# Patient Record
Sex: Female | Born: 1950 | Race: White | Hispanic: No | Marital: Married | Smoking: Never smoker
Health system: Southern US, Community
[De-identification: ages and names within clinical notes are randomized; demographics above are authoritative.]

## PROBLEM LIST (undated history)

## (undated) DIAGNOSIS — K219 Gastro-esophageal reflux disease without esophagitis: Secondary | ICD-10-CM

## (undated) DIAGNOSIS — E559 Vitamin D deficiency, unspecified: Secondary | ICD-10-CM

## (undated) DIAGNOSIS — E785 Hyperlipidemia, unspecified: Secondary | ICD-10-CM

## (undated) DIAGNOSIS — T7840XA Allergy, unspecified, initial encounter: Secondary | ICD-10-CM

## (undated) DIAGNOSIS — M810 Age-related osteoporosis without current pathological fracture: Secondary | ICD-10-CM

## (undated) HISTORY — DX: Age-related osteoporosis without current pathological fracture: M81.0

## (undated) HISTORY — DX: Allergy, unspecified, initial encounter: T78.40XA

## (undated) HISTORY — DX: Vitamin D deficiency, unspecified: E55.9

## (undated) HISTORY — DX: Hyperlipidemia, unspecified: E78.5

## (undated) HISTORY — DX: Gastro-esophageal reflux disease without esophagitis: K21.9

---

## 1998-09-04 ENCOUNTER — Other Ambulatory Visit: Admission: RE | Admit: 1998-09-04 | Discharge: 1998-09-04 | Payer: Self-pay | Admitting: Obstetrics and Gynecology

## 1998-12-11 ENCOUNTER — Ambulatory Visit: Admission: RE | Admit: 1998-12-11 | Discharge: 1998-12-11 | Payer: Self-pay | Admitting: Gynecologic Oncology

## 1999-08-13 ENCOUNTER — Other Ambulatory Visit: Admission: RE | Admit: 1999-08-13 | Discharge: 1999-08-13 | Payer: Self-pay | Admitting: Radiology

## 1999-09-11 ENCOUNTER — Other Ambulatory Visit: Admission: RE | Admit: 1999-09-11 | Discharge: 1999-09-11 | Payer: Self-pay | Admitting: Obstetrics and Gynecology

## 1999-10-01 ENCOUNTER — Ambulatory Visit (HOSPITAL_COMMUNITY): Admission: RE | Admit: 1999-10-01 | Discharge: 1999-10-01 | Payer: Self-pay | Admitting: Gastroenterology

## 2000-07-01 ENCOUNTER — Encounter: Payer: Self-pay | Admitting: Obstetrics and Gynecology

## 2000-07-01 ENCOUNTER — Encounter: Admission: RE | Admit: 2000-07-01 | Discharge: 2000-07-01 | Payer: Self-pay | Admitting: Obstetrics and Gynecology

## 2001-09-07 ENCOUNTER — Other Ambulatory Visit: Admission: RE | Admit: 2001-09-07 | Discharge: 2001-09-07 | Payer: Self-pay | Admitting: Obstetrics and Gynecology

## 2002-11-29 ENCOUNTER — Other Ambulatory Visit: Admission: RE | Admit: 2002-11-29 | Discharge: 2002-11-29 | Payer: Self-pay | Admitting: Obstetrics and Gynecology

## 2003-12-05 ENCOUNTER — Other Ambulatory Visit: Admission: RE | Admit: 2003-12-05 | Discharge: 2003-12-05 | Payer: Self-pay | Admitting: Obstetrics and Gynecology

## 2004-12-18 ENCOUNTER — Other Ambulatory Visit: Admission: RE | Admit: 2004-12-18 | Discharge: 2004-12-18 | Payer: Self-pay | Admitting: Obstetrics and Gynecology

## 2009-03-20 ENCOUNTER — Encounter (INDEPENDENT_AMBULATORY_CARE_PROVIDER_SITE_OTHER): Payer: Self-pay | Admitting: *Deleted

## 2009-10-07 ENCOUNTER — Encounter (INDEPENDENT_AMBULATORY_CARE_PROVIDER_SITE_OTHER): Payer: Self-pay | Admitting: *Deleted

## 2009-11-06 ENCOUNTER — Encounter (INDEPENDENT_AMBULATORY_CARE_PROVIDER_SITE_OTHER): Payer: Self-pay | Admitting: *Deleted

## 2009-11-11 ENCOUNTER — Ambulatory Visit: Payer: Self-pay | Admitting: Gastroenterology

## 2009-11-26 ENCOUNTER — Ambulatory Visit: Payer: Self-pay | Admitting: Gastroenterology

## 2010-04-15 NOTE — Miscellaneous (Signed)
Summary: [facility]  Clinical Lists Changes  Medications: Added new medication of MOVIPREP 100 GM  SOLR (PEG-KCL-NACL-NASULF-NA ASC-C) As per prep instructions. - Signed Rx of MOVIPREP 100 GM  SOLR (PEG-KCL-NACL-NASULF-NA ASC-C) As per prep instructions.;  #1 x 0;  Signed;  Entered by: Durwin Glaze RN;  Authorized by: Louis Meckel MD;  Method used: Electronically to [facility]  [phone]*, [address] [place], [place], [place]  [address], Ph: [phone] or [phone], Fax: [phone] Observations: Added new observation of NKA: T (11/11/2009 12:38)    Prescriptions: MOVIPREP 100 GM  SOLR (PEG-KCL-NACL-NASULF-NA ASC-C) As per prep instructions.  #1 x 0   Entered by:   Durwin Glaze RN   Authorized by:   Louis Meckel MD   Signed by:   Durwin Glaze RN on 11/11/2009   Method used:   Electronically to        [facility]  [phone]* (retail)       [address]       [place], [place]  [address]       Ph: [phone] or [phone]       Fax: [phone]   RxID:   [id number]

## 2010-04-15 NOTE — Letter (Signed)
Summary: Previsit letter  [facility]  [address] [place], [place] [address]   Phone: [phone]  Fax: [phone]       10/07/2009 MRN: [id number]  Sheila Gray [address] [place], [place]  [address]  [place]  Dear Ms. Szeto,  Welcome to the Gastroenterology Division at [facility].    You are scheduled to see a nurse for your pre-procedure visit on November 11, 2009 at 1:00pm on the 3rd floor at [facility], [address]  We ask that you try to arrive at our office 15 minutes prior to your appointment time to allow for check-in.  Your nurse visit will consist of discussing your medical and surgical history, your immediate family medical history, and your medications.    Please bring a complete list of all your medications or, if you prefer, bring the medication bottles and we will list them.  We will need to be aware of both prescribed and over the counter drugs.  We will need to know exact dosage information as well.  If you are on blood thinners (Coumadin, Plavix, Aggrenox, Ticlid, etc.) please call our office today/prior to your appointment, as we need to consult with your physician about holding your medication.   Please be prepared to read and sign documents such as consent forms, a financial agreement, and acknowledgement forms.  If necessary, and with your consent, a friend or relative is welcome to sit-in on the nurse visit with you.  Please bring your insurance card so that we may make a copy of it.  If your insurance requires a referral to see a specialist, please bring your referral form from your primary care physician.  No co-pay is required for this nurse visit.     If you cannot keep your appointment, please call [phone] to cancel or reschedule prior to your appointment date.  This allows [place] the opportunity to schedule an appointment for another patient in need of care.    Thank you for choosing [facility] for your  medical needs.  We appreciate the opportunity to care for you.  Please visit [place] at our website  to learn more about our practice.                     Sincerely.                                                                                                                   The Gastroenterology Division

## 2010-04-15 NOTE — Letter (Signed)
Summary: Moviprep Instructions  [facility]  [address]   [place], [place] [address]   Phone: [phone]  Fax: [phone]       Sheila Gray    [birth year]    MRN: [id number]        Procedure Day Dorna Bloom: Tuesday, 11-26-09     Arrival Time: 8:30 a.m.     Procedure Time: 9:30 a.m.     Location of Procedure:                     x  [facility] (4th Floor)   PREPARATION FOR COLONOSCOPY WITH MOVIPREP   Starting 5 days prior to your procedure 11-21-09  do not eat nuts, seeds, popcorn, corn, beans, peas,  salads, or any raw vegetables.  Do not take any fiber supplements (e.g. Metamucil, Citrucel, and Benefiber).  THE DAY BEFORE YOUR PROCEDURE         DATE:  11-25-09   DAY: Monday  1.  Drink clear liquids the entire day-NO SOLID FOOD  2.  Do not drink anything colored red or purple.  Avoid juices with pulp.  No orange juice.  3.  Drink at least 64 oz. (8 glasses) of fluid/clear liquids during the day to prevent dehydration and help the prep work efficiently.  CLEAR LIQUIDS INCLUDE: Water Jello Ice Popsicles Tea (sugar ok, no milk/cream) Powdered fruit flavored drinks Coffee (sugar ok, no milk/cream) Gatorade Juice: apple, white grape, white cranberry  Lemonade Clear bullion, consomm, broth Carbonated beverages (any kind) Strained chicken noodle soup Hard Candy                             4.  In the morning, mix first dose of MoviPrep solution:    Empty 1 Pouch A and 1 Pouch B into the disposable container    Add lukewarm drinking water to the top line of the container. Mix to dissolve    Refrigerate (mixed solution should be used within 24 hrs)  5.  Begin drinking the prep at 5:00 p.m. The MoviPrep container is divided by 4 marks.   Every 15 minutes drink the solution down to the next mark (approximately 8 oz) until the full liter is complete.   6.  Follow completed prep with 16 oz of clear liquid of your choice (Nothing red or purple).   Continue to drink clear liquids until bedtime.  7.  Before going to bed, mix second dose of MoviPrep solution:    Empty 1 Pouch A and 1 Pouch B into the disposable container    Add lukewarm drinking water to the top line of the container. Mix to dissolve    Refrigerate  THE DAY OF YOUR PROCEDURE      DATE: 11-26-09   DAY: Tuesday  Beginning at  4:30 a.m. (5 hours before procedure)         1. Every 15 minutes, drink the solution down to the next mark (approx 8 oz) until the full liter is complete.  2. Follow completed prep with 16 oz. of clear liquid of your choice.    3. You may drink clear liquids until 7:30 a.m. (2 Hours before procedure).   MEDICATION INSTRUCTIONS  Unless otherwise instructed, you should take regular prescription medications with a small sip of water   as early as possible the morning of your procedure.          OTHER INSTRUCTIONS  You  will need a responsible adult at least 61 years of age to accompany you and drive you home.   This person must remain in the waiting room during your procedure.  Wear loose fitting clothing that is easily removed.  Leave jewelry and other valuables at home.  However, you may wish to bring a book to read or  an iPod/MP3 player to listen to music as you wait for your procedure to start.  Remove all body piercing jewelry and leave at home.  Total time from sign-in until discharge is approximately 2-3 hours.  You should go home directly after your procedure and rest.  You can resume normal activities the  day after your procedure.  The day of your procedure you should not:   Drive   Make legal decisions   Operate machinery   Drink alcohol   Return to work  You will receive specific instructions about eating, activities and medications before you leave.    The above instructions have been reviewed and explained to me by   Durwin Glaze RN  November 11, 2009 12:58 PM    I fully understand and can verbalize  these instructions _____________________________ Date _________

## 2010-04-15 NOTE — Procedures (Signed)
Summary: Colonoscopy  Patient: Sheila Gray Note: All result statuses are Final unless otherwise noted.  Tests: (1) Colonoscopy (COL)   COL Colonoscopy           DONE     [facility]     [address]     [place], [place]  [address]           COLONOSCOPY PROCEDURE REPORT           PATIENT:  Sheila Gray, Sheila Gray  MR#:  [id number]     BIRTHDATE:  May 11, 1950, 59 yrs. old  GENDER:  female           ENDOSCOPIST:  Barbette Hair. Arlyce Dice, MD     Referred by:           PROCEDURE DATE:  11/26/2009     PROCEDURE:  Diagnostic Colonoscopy     ASA CLASS:  Class I     INDICATIONS:  1) Routine Risk Screening           MEDICATIONS:   Fentanyl 75 mcg IV, Versed 9 mg IV           DESCRIPTION OF PROCEDURE:   After the risks benefits and     alternatives of the procedure were thoroughly explained, informed     consent was obtained.  Digital rectal exam was performed and     revealed no abnormalities.   The LB CF-[id number] endoscope     was introduced through the anus and advanced to the cecum, which     was identified by both the appendix and ileocecal valve, without     limitations.  The quality of the prep was excellent, using     MoviPrep.  The instrument was then slowly withdrawn as the colon     was fully examined.     <<PROCEDUREIMAGES>>           FINDINGS:  A normal appearing cecum, ileocecal valve, and     appendiceal orifice were identified. The ascending, hepatic     flexure, transverse, splenic flexure, descending, sigmoid colon,     and rectum appeared unremarkable (see image1, image4, image5,     image6, image7, image8, image9, image10, image13, image14,     image16, and image18).   Retroflexed views in the rectum revealed     no abnormalities.    The time to cecum =  9.25  minutes. The scope     was then withdrawn (time =  6.75  min) from the patient and the     procedure completed.           COMPLICATIONS:  None           ENDOSCOPIC IMPRESSION:     1) Normal colon  RECOMMENDATIONS:     1) Continue current colorectal screening recommendations for     "routine risk" patients with a repeat colonoscopy in 10 years.           REPEAT EXAM:  In 10 year(s) for Colonoscopy.           ______________________________     Barbette Hair. Arlyce Dice, MD           CC: [name], MD           n.     Rosalie Doctor:   Barbette Hair. Kaplan at 11/26/2009 10:38 AM           Edwina Barth, [phone]  Note: An exclamation mark (!) indicates a result that was not  dispersed into the flowsheet. Document Creation Date: 11/26/2009 10:39 AM _______________________________________________________________________  (1) Order result status: Final Collection or observation date-time: 11/26/2009 10:32 Requested date-time:  Receipt date-time:  Reported date-time:  Referring Physician:   Ordering Physician: Melvia Heaps [phone]) Specimen Source:  Source: Launa Grill Order Number: [id number] Lab site:   Appended Document: Colonoscopy    Clinical Lists Changes  Observations: Added new observation of COLONNXTDUE: 11/2019 (11/26/2009 14:39)

## 2010-04-15 NOTE — Letter (Signed)
Summary: Colonoscopy Letter  [facility]  [address] [place], [place] [address]   Phone: [phone]  Fax: [phone]      March 20, 2009 MRN: [id number]   Sheila Gray [address] [place], [place]  [address]   Dear Ms. Berrocal,   According to your medical record, it is time for you to schedule a Colonoscopy. The American Cancer Society recommends this procedure as a method to detect early colon cancer. Patients with a family history of colon cancer, or a personal history of colon polyps or inflammatory bowel disease are at increased risk.  This letter has beeen generated based on the recommendations made at the time of your procedure. If you feel that in your particular situation this may no longer apply, please contact our office.  Please call our office at [phone] to schedule this appointment or to update your records at your earliest convenience.  Thank you for cooperating with [place] to provide you with the very best care possible.   Sincerely,  Barbette Hair. Arlyce Dice, M.D.  [facility] [phone]

## 2010-08-01 NOTE — Procedures (Signed)
[facility]  Patient:    Sheila Gray                            MRN: [id number] Proc. Date: 10/01/99 Attending:  Barbette Hair. Arlyce Dice, M.D. [facility] CC:         Miguel Aschoff, M.D.                           Procedure Report  PROCEDURE:  Colonoscopy.  HISTORY OF PRESENT ILLNESS:  Ms. Wander has heme positive stools. She has recently has symptomatic hemorrhoids. She has a family history of colon polyps/colonoscopy 5 years ago was negative.  INFORMED CONSENT:  The patient provided consent after risks, benefits, and alternatives were explained.  MEDICATIONS:  Versed 8, fentanyl 100 mcg IV.  DESCRIPTION OF PROCEDURE:  The patient was placed in the left lateral decubitus position and administered continuous low flow oxygen and was placed on pulse oximetry. The Olympus video colonoscope was inserted into the cecum which was identified by the ileocecal valve. The prep was good. The scope was then withdrawn. All areas of the colon were examined.  FINDINGS: 1. Internal hemorrhoids were seen on retroflexed view of the rectal vault. 2. Normal sigmoid, descending, transverse, ascending colon and cecum.  IMPRESSION: 1. Internal hemorrhoids. 2. Heme positive stools--probably secondary to #1.  RECOMMENDATIONS: Follow-up hemoccults in 2 weeks; If still positive would consider upper endoscopy. DD:  10/01/99 TD:  10/03/99 Job: [id number]

## 2011-04-02 ENCOUNTER — Ambulatory Visit
Admission: RE | Admit: 2011-04-02 | Discharge: 2011-04-02 | Disposition: A | Discharge status: Home / Self Care | Payer: BC Managed Care – PPO | Source: Ambulatory Visit | Attending: Neurology | Admitting: Neurology

## 2011-04-02 ENCOUNTER — Other Ambulatory Visit: Payer: Self-pay | Admitting: Neurology

## 2011-04-02 DIAGNOSIS — IMO0001 Reserved for inherently not codable concepts without codable children: Secondary | ICD-10-CM

## 2012-11-02 ENCOUNTER — Other Ambulatory Visit: Payer: Self-pay | Admitting: Internal Medicine

## 2012-11-02 DIAGNOSIS — M542 Cervicalgia: Secondary | ICD-10-CM

## 2012-11-02 DIAGNOSIS — R202 Paresthesia of skin: Secondary | ICD-10-CM

## 2012-11-04 ENCOUNTER — Ambulatory Visit
Admission: RE | Admit: 2012-11-04 | Discharge: 2012-11-04 | Disposition: A | Discharge status: Home / Self Care | Payer: BC Managed Care – PPO | Source: Ambulatory Visit | Attending: Internal Medicine | Admitting: Internal Medicine

## 2012-11-04 DIAGNOSIS — R202 Paresthesia of skin: Secondary | ICD-10-CM

## 2012-11-04 DIAGNOSIS — M542 Cervicalgia: Secondary | ICD-10-CM

## 2013-04-10 ENCOUNTER — Ambulatory Visit (INDEPENDENT_AMBULATORY_CARE_PROVIDER_SITE_OTHER): Payer: BC Managed Care – PPO | Admitting: Internal Medicine

## 2013-04-10 ENCOUNTER — Encounter: Payer: Self-pay | Admitting: Internal Medicine

## 2013-04-10 VITALS — BP 122/76 | HR 68 | Temp 97.9°F | Resp 18 | Wt 113.6 lb

## 2013-04-10 DIAGNOSIS — J301 Allergic rhinitis due to pollen: Secondary | ICD-10-CM | POA: Insufficient documentation

## 2013-04-10 DIAGNOSIS — N3000 Acute cystitis without hematuria: Secondary | ICD-10-CM

## 2013-04-10 DIAGNOSIS — I10 Essential (primary) hypertension: Secondary | ICD-10-CM

## 2013-04-10 DIAGNOSIS — M810 Age-related osteoporosis without current pathological fracture: Secondary | ICD-10-CM | POA: Insufficient documentation

## 2013-04-10 DIAGNOSIS — E559 Vitamin D deficiency, unspecified: Secondary | ICD-10-CM | POA: Insufficient documentation

## 2013-04-10 DIAGNOSIS — R109 Unspecified abdominal pain: Secondary | ICD-10-CM

## 2013-04-10 LAB — CBC WITH DIFFERENTIAL/PLATELET
BASOS PCT: 0 % (ref 0–1)
Basophils Absolute: 0 10*3/uL (ref 0.0–0.1)
Eosinophils Absolute: 0.1 10*3/uL (ref 0.0–0.7)
Eosinophils Relative: 2 % (ref 0–5)
HCT: 41.9 % (ref 36.0–46.0)
HEMOGLOBIN: 14.2 g/dL (ref 12.0–15.0)
LYMPHS ABS: 2.3 10*3/uL (ref 0.7–4.0)
LYMPHS PCT: 37 % (ref 12–46)
MCH: 30.1 pg (ref 26.0–34.0)
MCHC: 33.9 g/dL (ref 30.0–36.0)
MCV: 89 fL (ref 78.0–100.0)
MONOS PCT: 7 % (ref 3–12)
Monocytes Absolute: 0.4 10*3/uL (ref 0.1–1.0)
NEUTROS ABS: 3.3 10*3/uL (ref 1.7–7.7)
NEUTROS PCT: 54 % (ref 43–77)
Platelets: 249 10*3/uL (ref 150–400)
RBC: 4.71 MIL/uL (ref 3.87–5.11)
RDW: 13.8 % (ref 11.5–15.5)
WBC: 6.1 10*3/uL (ref 4.0–10.5)

## 2013-04-10 MED ORDER — METRONIDAZOLE 250 MG PO TABS: 250.0000 mg | ORAL_TABLET | Freq: Three times a day (TID) | ORAL | Status: AC

## 2013-04-10 MED ORDER — HYOSCYAMINE SULFATE 0.125 MG SL SUBL: SUBLINGUAL_TABLET | SUBLINGUAL | Status: DC

## 2013-04-10 NOTE — Patient Instructions (Signed)
Pending your lab test results,  Take your Cipro 500 mg tabs twice daily with food And  New Rx Flagyl 250 mg 3 x daily with food   If not significantly better by Luanna Salk or Thurs Morning -  Call for recheck or to schedule Abdominal Ultrasound   Call if symptoms change         Abdominal Pain, Adult Many things can cause abdominal pain. Usually, abdominal pain is not caused by a disease and will improve without treatment. It can often be observed and treated at home. Your health care provider will do a physical exam and possibly order blood tests and X-rays to help determine the seriousness of your pain. However, in many cases, more time must pass before a clear cause of the pain can be found. Before that point, your health care provider may not know if you need more testing or further treatment. HOME CARE INSTRUCTIONS  Monitor your abdominal pain for any changes. The following actions may help to alleviate any discomfort you are experiencing:  Only take over-the-counter or prescription medicines as directed by your health care provider.  Do not take laxatives unless directed to do so by your health care provider.  Try a clear liquid diet (broth, tea, or water) as directed by your health care provider. Slowly move to a bland diet as tolerated. SEEK MEDICAL CARE IF:  You have unexplained abdominal pain.  You have abdominal pain associated with nausea or diarrhea.  You have pain when you urinate or have a bowel movement.  You experience abdominal pain that wakes you in the night.  You have abdominal pain that is worsened or improved by eating food.  You have abdominal pain that is worsened with eating fatty foods. SEEK IMMEDIATE MEDICAL CARE IF:   Your pain does not go away within 2 hours.  You have a fever.  You keep throwing up (vomiting).  Your pain is felt only in portions of the abdomen, such as the right side or the left lower portion of the abdomen.  You pass  bloody or black tarry stools. MAKE SURE YOU:  Understand these instructions.   Will watch your condition.   Will get help right away if you are not doing well or get worse.  Document Released: 12/10/2004 Document Revised: 12/21/2012 Document Reviewed: 11/09/2012 [facility] Patient Information 2014 [place].   Abdominal Pain, Adult Many things can cause belly (abdominal) pain. Most times, the belly pain is not dangerous. Many cases of belly pain can be watched and treated at home. HOME CARE   Do not take medicines that help you go poop (laxatives) unless told to by your doctor.  Only take medicine as told by your doctor.  Eat or drink as told by your doctor. Your doctor will tell you if you should be on a special diet. GET HELP IF:  You do not know what is causing your belly pain.  You have belly pain while you are sick to your stomach (nauseous) or have runny poop (diarrhea).  You have pain while you pee or poop.  Your belly pain wakes you up at night.  You have belly pain that gets worse or better when you eat.  You have belly pain that gets worse when you eat fatty foods. GET HELP RIGHT AWAY IF:   The pain does not go away within 2 hours.  You have a fever.  You keep throwing up (vomiting).  The pain changes and is only in  the right or left part of the belly.  You have bloody or tarry looking poop. MAKE SURE YOU:   Understand these instructions.  Will watch your condition.  Will get help right away if you are not doing well or get worse. Document Released: 08/19/2007 Document Revised: 12/21/2012 Document Reviewed: 11/09/2012 [facility] Patient Information 2014 [place].

## 2013-04-10 NOTE — Progress Notes (Signed)
   Subjective:    Patient ID: Sheila Gray, female    DOB: 07/14/1950, 63 y.o.   MRN: [id number]  Urinary Tract Infection  Associated symptoms include chills and nausea. Pertinent negatives include no frequency, hematuria or vomiting.  Abdominal Pain This is a new problem. The current episode started in the past 7 days. The onset quality is sudden. The problem occurs constantly. The problem has been waxing and waning. The pain is located in the periumbilical region. The pain is mild. The quality of the pain is aching, colicky and cramping. The abdominal pain radiates to the RUQ. Associated symptoms include belching, a fever, flatus and nausea. Pertinent negatives include no anorexia, constipation, diarrhea, dysuria, frequency, headaches, hematochezia, hematuria, melena, myalgias, vomiting or weight loss.      Review of Systems  Constitutional: Positive for fever, chills and diaphoresis. Negative for weight loss.  HENT: Negative.   Eyes: Negative.   Respiratory: Negative.   Cardiovascular: Negative.   Gastrointestinal: Positive for nausea, abdominal pain and flatus. Negative for vomiting, diarrhea, constipation, melena, hematochezia and anorexia.  Endocrine: Negative.   Genitourinary: Negative.  Negative for dysuria, frequency and hematuria.  Musculoskeletal: Negative for myalgias.  Allergic/Immunologic: Negative.   Neurological: Negative.  Negative for headaches.  Hematological: Positive for adenopathy.  Psychiatric/Behavioral: Negative.        Objective:   Physical Exam  Nursing note and vitals reviewed. Constitutional: She is oriented to person, place, and time. She appears well-developed and well-nourished.  HENT:  Head: Normocephalic and atraumatic.  Mouth/Throat: No oropharyngeal exudate.  Eyes: EOM are normal. Pupils are equal, round, and reactive to light.  Neck: Normal range of motion. Neck supple. No JVD present. No thyromegaly present.  Cardiovascular: Normal rate,  regular rhythm and normal heart sounds.   No murmur heard. Pulmonary/Chest: Effort normal and breath sounds normal. No respiratory distress. She has no wheezes. She has no rales.  Abdominal: She exhibits distension. There is no tenderness. There is no rebound.  Slight distension with RUQ tenderness w/o guarding or rebound. BS hyperactive  Musculoskeletal: Normal range of motion. She exhibits no edema and no tenderness.  Lymphadenopathy:    She has no cervical adenopathy.  Neurological: She is alert and oriented to person, place, and time. No cranial nerve deficit.  Skin: Skin is warm and dry. No rash noted. No erythema.          Assessment & Plan:  1. Abdominal pain, Obscure etiology - DDx includes GB, Gastroduodenal, SB or Colon or UTI.  - Urine Microscopic - Urine culture - CBC with Differential - Hepatic function panel - BASIC METABOLIC PANEL WITH GFR - Amylase  Has Rx Cipro 500 mg - advised to take bid pending labs and clinical response MetroNIDAZOLE (FLAGYL) 250 MG tablet; Take 1 tablet (250 mg total) by mouth 3 (three) times daily.  Dispense: 30 tablet; Refill: 0 Hyoscyamine (LEVSIN SL) 0.125 MG SL tablet; Take 1 or 2 tablets under tongue 4 x day or every 4 hours as needed for nausea, bloating, cramping or diarrhea ( Max 8 tab/24 Hr)  Dispense: 30 tablet; Refill: 3 Diet discussed.

## 2013-04-11 LAB — HEPATIC FUNCTION PANEL
ALT: 12 U/L (ref 0–35)
AST: 16 U/L (ref 0–37)
Albumin: 4.7 g/dL (ref 3.5–5.2)
Alkaline Phosphatase: 44 U/L (ref 39–117)
BILIRUBIN DIRECT: 0.1 mg/dL (ref 0.0–0.3)
BILIRUBIN INDIRECT: 0.3 mg/dL (ref 0.0–0.9)
BILIRUBIN TOTAL: 0.4 mg/dL (ref 0.3–1.2)
Total Protein: 7.3 g/dL (ref 6.0–8.3)

## 2013-04-11 LAB — BASIC METABOLIC PANEL WITH GFR
BUN: 15 mg/dL (ref 6–23)
CALCIUM: 10.2 mg/dL (ref 8.4–10.5)
CO2: 31 meq/L (ref 19–32)
Chloride: 100 mEq/L (ref 96–112)
Creat: 0.89 mg/dL (ref 0.50–1.10)
GFR, Est African American: 80 mL/min
GFR, Est Non African American: 69 mL/min
GLUCOSE: 86 mg/dL (ref 70–99)
Potassium: 4.7 mEq/L (ref 3.5–5.3)
SODIUM: 140 meq/L (ref 135–145)

## 2013-04-11 LAB — AMYLASE: Amylase: 76 U/L (ref 0–105)

## 2013-04-11 LAB — URINE CULTURE
COLONY COUNT: NO GROWTH
ORGANISM ID, BACTERIA: NO GROWTH

## 2013-04-11 LAB — URINALYSIS, MICROSCOPIC ONLY
Bacteria, UA: NONE SEEN
Casts: NONE SEEN
Crystals: NONE SEEN
SQUAMOUS EPITHELIAL / LPF: NONE SEEN

## 2013-11-09 ENCOUNTER — Encounter: Payer: Self-pay | Admitting: Internal Medicine

## 2013-11-09 ENCOUNTER — Ambulatory Visit (INDEPENDENT_AMBULATORY_CARE_PROVIDER_SITE_OTHER): Payer: BC Managed Care – PPO | Admitting: Internal Medicine

## 2013-11-09 VITALS — BP 116/64 | HR 78 | Temp 98.0°F | Resp 16 | Ht 62.0 in | Wt 113.0 lb

## 2013-11-09 DIAGNOSIS — E559 Vitamin D deficiency, unspecified: Secondary | ICD-10-CM

## 2013-11-09 DIAGNOSIS — Z111 Encounter for screening for respiratory tuberculosis: Secondary | ICD-10-CM

## 2013-11-09 DIAGNOSIS — R7402 Elevation of levels of lactic acid dehydrogenase (LDH): Secondary | ICD-10-CM

## 2013-11-09 DIAGNOSIS — Z1212 Encounter for screening for malignant neoplasm of rectum: Secondary | ICD-10-CM

## 2013-11-09 DIAGNOSIS — Z113 Encounter for screening for infections with a predominantly sexual mode of transmission: Secondary | ICD-10-CM

## 2013-11-09 DIAGNOSIS — Z8249 Family history of ischemic heart disease and other diseases of the circulatory system: Secondary | ICD-10-CM

## 2013-11-09 DIAGNOSIS — R7401 Elevation of levels of liver transaminase levels: Secondary | ICD-10-CM

## 2013-11-09 DIAGNOSIS — Z0000 Encounter for general adult medical examination without abnormal findings: Secondary | ICD-10-CM

## 2013-11-09 DIAGNOSIS — R740 Nonspecific elevation of levels of transaminase and lactic acid dehydrogenase [LDH]: Secondary | ICD-10-CM

## 2013-11-09 LAB — LIPID PANEL
Cholesterol: 236 mg/dL — ABNORMAL HIGH (ref 0–200)
HDL: 105 mg/dL (ref >39)
LDL CALC: 110 mg/dL — AB (ref 0–99)
TRIGLYCERIDES: 105 mg/dL (ref <150)
Total CHOL/HDL Ratio: 2.2 Ratio
VLDL: 21 mg/dL (ref 0–40)

## 2013-11-09 LAB — CBC WITH DIFFERENTIAL/PLATELET
BASOS PCT: 1 % (ref 0–1)
Basophils Absolute: 0.1 10*3/uL (ref 0.0–0.1)
EOS ABS: 0.1 10*3/uL (ref 0.0–0.7)
Eosinophils Relative: 2 % (ref 0–5)
HCT: 43 % (ref 36.0–46.0)
Hemoglobin: 14.9 g/dL (ref 12.0–15.0)
Lymphocytes Relative: 36 % (ref 12–46)
Lymphs Abs: 2.4 10*3/uL (ref 0.7–4.0)
MCH: 30.4 pg (ref 26.0–34.0)
MCHC: 34.7 g/dL (ref 30.0–36.0)
MCV: 87.8 fL (ref 78.0–100.0)
Monocytes Absolute: 0.3 10*3/uL (ref 0.1–1.0)
Monocytes Relative: 5 % (ref 3–12)
NEUTROS PCT: 56 % (ref 43–77)
Neutro Abs: 3.8 10*3/uL (ref 1.7–7.7)
PLATELETS: 244 10*3/uL (ref 150–400)
RBC: 4.9 MIL/uL (ref 3.87–5.11)
RDW: 14 % (ref 11.5–15.5)
WBC: 6.7 10*3/uL (ref 4.0–10.5)

## 2013-11-09 LAB — HEPATIC FUNCTION PANEL
ALK PHOS: 45 U/L (ref 39–117)
ALT: 13 U/L (ref 0–35)
AST: 15 U/L (ref 0–37)
Albumin: 4.7 g/dL (ref 3.5–5.2)
BILIRUBIN INDIRECT: 0.5 mg/dL (ref 0.2–1.2)
Bilirubin, Direct: 0.1 mg/dL (ref 0.0–0.3)
TOTAL PROTEIN: 7.4 g/dL (ref 6.0–8.3)
Total Bilirubin: 0.6 mg/dL (ref 0.2–1.2)

## 2013-11-09 LAB — VITAMIN B12: Vitamin B-12: 411 pg/mL (ref 211–911)

## 2013-11-09 LAB — BASIC METABOLIC PANEL WITH GFR
BUN: 16 mg/dL (ref 6–23)
CO2: 26 mEq/L (ref 19–32)
Calcium: 9.2 mg/dL (ref 8.4–10.5)
Chloride: 102 mEq/L (ref 96–112)
Creat: 0.8 mg/dL (ref 0.50–1.10)
GFR, EST NON AFRICAN AMERICAN: 78 mL/min
GFR, Est African American: >89 mL/min
GLUCOSE: 88 mg/dL (ref 70–99)
Potassium: 4.2 mEq/L (ref 3.5–5.3)
SODIUM: 138 meq/L (ref 135–145)

## 2013-11-09 LAB — TSH: TSH: 3.432 u[IU]/mL (ref 0.350–4.500)

## 2013-11-09 LAB — MAGNESIUM: Magnesium: 2.1 mg/dL (ref 1.5–2.5)

## 2013-11-09 LAB — HEMOGLOBIN A1C
Hgb A1c MFr Bld: 5.5 % (ref <5.7)
MEAN PLASMA GLUCOSE: 111 mg/dL (ref <117)

## 2013-11-09 NOTE — Progress Notes (Signed)
Patient ID: Sheila Gray, female   DOB: 10/17/1950, 62 y.o.   MRN: [id number]   Annual Screening Comprehensive Examination   This very nice 63 y.o.MWF presents for complete physical.  Patient has no major health issues.  Screening labs from 1 year ago were all WNL and patient has remained well since that time.    Medication Sig  . Calcium Carb w/D  Take by mouth daily.  Marland Kitchen VITAMIN D Take 4,000 Units by mouth daily.  . Ibuprofen 200 MG CAPS Take by mouth. 2 at bedtime prn  . MULTIVITAMIN Take 1 tablet by mouth daily.  . NON FORMULARY Allergy shot every 2 weeks  . PROBIOTIC DAILY Take by mouth.   No Known Allergies  Past Medical History  Diagnosis Date  . Allergy   . Osteoporosis   . Vitamin D deficiency    Hx/o (-) colonoscopies 1997, 2001, & 2010 per Dr Deatra Ina recc 10 year f/u in 2020.  Family History  Problem Relation Age of Onset  . Hypertension Mother   . Kidney disease Father   . Heart disease Father    History   Social History  . Marital Status: Married    Spouse Name: N/A    Number of Children: N/A  . Years of Education: N/A   Occupational History  . 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 Smoking status: Never Smoker   . Smokeless tobacco: Never Used  . Alcohol Use: 1.5 oz/week    3 drink(s) per week  . Drug Use: No  . Sexual Activity: Not on file    ROS Constitutional: Denies fever, chills, weight loss/gain, headaches, insomnia, fatigue, night sweats, and change in appetite. Eyes: Denies redness, blurred vision, diplopia, discharge, itchy, watery eyes.  ENT: Denies discharge, congestion, post nasal drip, epistaxis, sore throat, earache, hearing loss, dental pain, Tinnitus, Vertigo, Sinus pain, snoring.  Cardio: Denies chest pain, palpitations, irregular heartbeat, syncope, dyspnea, diaphoresis, orthopnea, PND, claudication, edema Respiratory: denies cough, dyspnea, DOE, pleurisy, hoarseness, laryngitis, wheezing.   Gastrointestinal: Denies dysphagia, heartburn, reflux, water brash, pain, cramps, nausea, vomiting, bloating, diarrhea, constipation, hematemesis, melena, hematochezia, jaundice, hemorrhoids Genitourinary: Denies dysuria, frequency, urgency, nocturia, hesitancy, discharge, hematuria, flank pain Breast: Breast lumps, nipple discharge, bleeding.  Musculoskeletal: Denies arthralgia, myalgia, stiffness, Jt. Swelling, pain, limp, and strain/sprain. Skin: Denies puritis, rash, hives, warts, acne, eczema, changing in skin lesion Neuro: Weakness, tremor, incoordination, spasms, paresthesia, pain Psychiatric: Denies confusion, memory loss, sensory loss. Endocrine: Denies change in weight, skin, hair change, nocturia, and paresthesia, diabetic polys, visual blurring, hyper /hypo glycemic episodes.  Heme/Lymph: No excessive bleeding, bruising, enlarged lymph nodes.  Physical Exam  BP 116/64  Pulse 78  Temp 98 F   Resp 16  Ht 5\' 2"    Wt 113 lb   BMI 20.66 kg/m2  General Appearance: Well nourished and in no apparent distress. Eyes: PERRLA, EOMs, conjunctiva no swelling or erythema, normal fundi and vessels. Sinuses: No frontal/maxillary tenderness ENT/Mouth: EACs patent / TMs  nl. Nares clear without erythema, swelling, mucoid exudates. Oral hygiene is good. No erythema, swelling, or exudate. Tongue normal, non-obstructing. Tonsils not swollen or erythematous. Hearing normal.  Neck: Supple, thyroid normal. No bruits, nodes or JVD. Respiratory: Respiratory effort normal.  BS equal and clear bilateral without rales, rhonci, wheezing or stridor. Cardio: Heart sounds are normal with regular rate and rhythm and no murmurs, rubs or gallops. Peripheral pulses are normal and equal bilaterally without edema.  No aortic or femoral bruits. Chest: symmetric with normal excursions and percussion. Breasts: Deferred to GYN.  Abdomen: Flat, soft, with bowl sounds. Nontender, no guarding, rebound, hernias, masses,  or organomegaly.  Lymphatics: Non tender without lymphadenopathy.  Genitourinary: Deferred to GYN. Musculoskeletal: Full ROM all peripheral extremities, joint stability, 5/5 strength, and normal gait. Skin: Warm and dry without rashes, lesions, cyanosis, clubbing or  ecchymosis.  Neuro: Cranial nerves intact, reflexes equal bilaterally. Normal muscle tone, no cerebellar symptoms. Sensation intact.  Pysch: Awake and oriented X 3, normal affect, Insight and Judgment appropriate.   Assessment and Plan  1. Annual Screening Examination  Continue prudent diet as discussed, weight control, regular exercise, and medications. Routine screening labs and tests as requested with regular follow-up as recommended.

## 2013-11-09 NOTE — Patient Instructions (Signed)

## 2013-11-10 LAB — HEPATITIS B SURFACE ANTIBODY,QUALITATIVE: Hep B S Ab: NEGATIVE

## 2013-11-10 LAB — URINALYSIS, MICROSCOPIC ONLY
Bacteria, UA: NONE SEEN
CRYSTALS: NONE SEEN
Casts: NONE SEEN
SQUAMOUS EPITHELIAL / LPF: NONE SEEN

## 2013-11-10 LAB — RPR

## 2013-11-10 LAB — MICROALBUMIN / CREATININE URINE RATIO
Creatinine, Urine: 52.9 mg/dL
Microalb Creat Ratio: 9.5 mg/g (ref 0.0–30.0)
Microalb, Ur: 0.5 mg/dL (ref 0.00–1.89)

## 2013-11-10 LAB — HEPATITIS B CORE ANTIBODY, TOTAL: HEP B C TOTAL AB: NONREACTIVE

## 2013-11-10 LAB — HEPATITIS C ANTIBODY: HCV AB: NEGATIVE

## 2013-11-10 LAB — HIV ANTIBODY (ROUTINE TESTING W REFLEX): HIV: NONREACTIVE

## 2013-11-10 LAB — HEPATITIS A ANTIBODY, TOTAL: Hep A Total Ab: NONREACTIVE

## 2013-11-10 LAB — INSULIN, FASTING: Insulin fasting, serum: 7.1 u[IU]/mL (ref 2.0–19.6)

## 2013-11-10 LAB — VITAMIN D 25 HYDROXY (VIT D DEFICIENCY, FRACTURES): Vit D, 25-Hydroxy: 60 ng/mL (ref 30–89)

## 2013-11-13 LAB — TB SKIN TEST
INDURATION: 0 mm
TB Skin Test: NEGATIVE

## 2013-11-15 LAB — HEPATITIS B E ANTIBODY: HEPATITIS BE ANTIBODY: NONREACTIVE

## 2013-12-29 ENCOUNTER — Other Ambulatory Visit: Payer: Self-pay

## 2014-02-03 ENCOUNTER — Encounter: Payer: Self-pay | Admitting: *Deleted

## 2014-03-29 ENCOUNTER — Ambulatory Visit (INDEPENDENT_AMBULATORY_CARE_PROVIDER_SITE_OTHER): Payer: BLUE CROSS/BLUE SHIELD

## 2014-03-29 DIAGNOSIS — Z23 Encounter for immunization: Secondary | ICD-10-CM

## 2014-08-16 ENCOUNTER — Ambulatory Visit (HOSPITAL_COMMUNITY)
Admission: RE | Admit: 2014-08-16 | Discharge: 2014-08-16 | Disposition: A | Discharge status: Home / Self Care | Payer: Medicare Other | Source: Ambulatory Visit | Attending: Internal Medicine | Admitting: Internal Medicine

## 2014-08-16 ENCOUNTER — Encounter: Payer: Self-pay | Admitting: Physician Assistant

## 2014-08-16 ENCOUNTER — Ambulatory Visit (INDEPENDENT_AMBULATORY_CARE_PROVIDER_SITE_OTHER): Payer: Medicare Other | Admitting: Internal Medicine

## 2014-08-16 VITALS — BP 120/70 | HR 68 | Temp 97.7°F | Resp 16 | Ht 62.0 in | Wt 114.0 lb

## 2014-08-16 DIAGNOSIS — M459 Ankylosing spondylitis of unspecified sites in spine: Secondary | ICD-10-CM | POA: Diagnosis not present

## 2014-08-16 DIAGNOSIS — M5136 Other intervertebral disc degeneration, lumbar region: Secondary | ICD-10-CM | POA: Insufficient documentation

## 2014-08-16 DIAGNOSIS — M533 Sacrococcygeal disorders, not elsewhere classified: Secondary | ICD-10-CM

## 2014-08-16 DIAGNOSIS — M2550 Pain in unspecified joint: Secondary | ICD-10-CM

## 2014-08-16 DIAGNOSIS — M1289 Other specific arthropathies, not elsewhere classified, multiple sites: Secondary | ICD-10-CM

## 2014-08-16 DIAGNOSIS — M7070 Other bursitis of hip, unspecified hip: Secondary | ICD-10-CM | POA: Diagnosis not present

## 2014-08-16 DIAGNOSIS — Z79899 Other long term (current) drug therapy: Secondary | ICD-10-CM | POA: Diagnosis not present

## 2014-08-16 DIAGNOSIS — M545 Low back pain, unspecified: Secondary | ICD-10-CM

## 2014-08-16 DIAGNOSIS — M47816 Spondylosis without myelopathy or radiculopathy, lumbar region: Secondary | ICD-10-CM | POA: Diagnosis not present

## 2014-08-16 LAB — CBC WITH DIFFERENTIAL/PLATELET
Basophils Absolute: 0 10*3/uL (ref 0.0–0.1)
Basophils Relative: 0 % (ref 0–1)
EOS ABS: 0.1 10*3/uL (ref 0.0–0.7)
Eosinophils Relative: 1 % (ref 0–5)
HEMATOCRIT: 40.9 % (ref 36.0–46.0)
Hemoglobin: 14.3 g/dL (ref 12.0–15.0)
Lymphocytes Relative: 27 % (ref 12–46)
Lymphs Abs: 1.9 10*3/uL (ref 0.7–4.0)
MCH: 30.9 pg (ref 26.0–34.0)
MCHC: 35 g/dL (ref 30.0–36.0)
MCV: 88.3 fL (ref 78.0–100.0)
MONOS PCT: 7 % (ref 3–12)
MPV: 8.6 fL (ref 8.6–12.4)
Monocytes Absolute: 0.5 10*3/uL (ref 0.1–1.0)
NEUTROS PCT: 65 % (ref 43–77)
Neutro Abs: 4.7 10*3/uL (ref 1.7–7.7)
PLATELETS: 225 10*3/uL (ref 150–400)
RBC: 4.63 MIL/uL (ref 3.87–5.11)
RDW: 13.7 % (ref 11.5–15.5)
WBC: 7.2 10*3/uL (ref 4.0–10.5)

## 2014-08-16 LAB — C-REACTIVE PROTEIN

## 2014-08-16 LAB — RHEUMATOID FACTOR: Rhuematoid fact SerPl-aCnc: 17 IU/mL — ABNORMAL HIGH (ref <=14)

## 2014-08-16 MED ORDER — PREDNISONE 10 MG PO TABS: ORAL_TABLET | ORAL | Status: DC

## 2014-08-16 NOTE — Progress Notes (Signed)
Subjective:    Patient ID: Sheila Gray, female    DOB: 03-03-1951, 64 y.o.   MRN: [id number]  HPI  Patient presents with several month hx/ lower back and left buttock/hip pain worse when rolling over in bed and the also with standing or walking also. She also describes soreness & aching in the muscles of the low back area, as well as soreness in her hands, and wrist and also has hx/o soreness in her neck.  Sx's have acutely worsened over the last 2 weeks w/o knowledge of any particular activity that has precipitated her sx's. Denies any tickbites, fevers, sweats, rashes or acutely swollen joints.  Has taken Ibuprofen 200 mg x 2 bid w/o any benefit. Denies any temple or jaw pains or masticatory discomfort or HA's. Denies any stiffness - am or otherwise in the shoulder/pelvic limb girdle musculature.   Medication Sig  . CALCIUM + D Take by mouth daily.  Marland Kitchen VITAMIN D  Take 4,000 Units by mouth daily.  . fexofenadine  180 MG tablet Take 180 mg by mouth daily.  . Ibuprofen 200 MG CAPS Take by mouth. 2 at bedtime prn  . Multiple Vitamin  Take 1 tablet by mouth daily.  .  Allergy shot every 2 weeks  . Probiotic  DAILY  Take by mouth.  Marland Kitchen aspirin 81 MG tablet Take 81 mg by mouth daily.   No Known Allergies   Past Medical History  Diagnosis Date  . Allergy   . Osteoporosis   . Vitamin D deficiency    Review of Systems 10 point systems review negative except as above.    Objective:   Physical Exam  BP 120/70   Pulse 68  Temp 97.7 F  Resp 16  Ht 5' 2"    Wt 114 lb     BMI 20.85   HEENT - Eac's patent. TM's Nl. EOM's full. PERRLA. NasoOroPharynx clear. Neck - supple. Nl Thyroid. Carotids 2+ & No bruits, nodes, JVD Chest - Clear equal BS w/o Rales, rhonchi, wheezes. Cor - Nl HS. RRR w/o sig MGR. PP 1(+). No edema. Abd - No palpable organomegaly, masses or tenderness. BS nl. MS-  Muscle power, tone and bulk Nl. Gait is slow and calculated. Stiffened posture arising from sitting. Noted  tender Left>right para-lumbar spasm. Also tender over bilat SI jt areas and over Left ischial tuberosity. No signs of synovitis.  Neuro - No obvious Cr N abnormalities. Sensory, motor and Cerebellar functions appear Nl w/o focal abnormalities. Psyche - Mental status normal & appropriate.  No delusions, ideations or obvious mood abnormalities. Skin - clear exposed w/o rash.     Assessment & Plan:   1. Low back pain at multiple sites  - DG Lumbar Spine Complete; Future - predniSONE (DELTASONE) 10 MG tablet; 1 tab 3 x day for 3 days, then 1 tab 2 x day for 3 days, then 1 tab 1 x day for 5 days  Dispense: 20 tablet; Refill: 1  2. Sacralgia, r/o Sacroileitis  - DG Sacrum/Coccyx; Future - predniSONE (DELTASONE) 10 MG tablet; 1 tab 3 x day for 3 days, then 1 tab 2 x day for 3 days, then 1 tab 1 x day for 5 days  Dispense: 20 tablet; Refill: 1  3. Ischial bursitis, unspecified laterality  - predniSONE (DELTASONE) 10 MG tablet; 1 tab 3 x day for 3 days, then 1 tab 2 x day for 3 days, then 1 tab 1 x day for 5 days  Dispense: 20 tablet; Refill: 1  4. Polyarthralgias  - Sedimentation rate - C3 and C4 - Anti-DNA antibody, double-stranded - Cyclic citrul peptide antibody, IgG - C-reactive protein - Rheumatoid factor - Complement, total  5. Medication management  - CBC with Differential/Platelet  6. Ankylosing spondylitis  - HLA-B27 Antigen  7. Transient arthropathy, multiple sites  - discussed DDx with pt, and meds & SE's.

## 2014-08-17 LAB — SEDIMENTATION RATE: Sed Rate: 4 mm/hr (ref 0–30)

## 2014-08-17 LAB — ANTI-DNA ANTIBODY, DOUBLE-STRANDED: ds DNA Ab: <1 IU/mL

## 2014-08-17 LAB — C3 AND C4
C3 Complement: 98 mg/dL (ref 90–180)
C4 Complement: 31 mg/dL (ref 10–40)

## 2014-08-17 LAB — CYCLIC CITRUL PEPTIDE ANTIBODY, IGG

## 2014-08-17 LAB — HLA-B27 ANTIGEN: DNA Result:: NOT DETECTED

## 2014-08-18 LAB — COMPLEMENT, TOTAL: Compl, Total (CH50): 55 U/mL (ref 31–60)

## 2014-09-07 ENCOUNTER — Encounter: Payer: Self-pay | Admitting: Internal Medicine

## 2014-09-07 ENCOUNTER — Ambulatory Visit (INDEPENDENT_AMBULATORY_CARE_PROVIDER_SITE_OTHER): Payer: Medicare Other | Admitting: Internal Medicine

## 2014-09-07 VITALS — BP 112/66 | HR 64 | Temp 97.0°F | Resp 16 | Ht 62.0 in | Wt 113.6 lb

## 2014-09-07 DIAGNOSIS — M5489 Other dorsalgia: Secondary | ICD-10-CM

## 2014-09-07 MED ORDER — MELOXICAM 15 MG PO TABS: ORAL_TABLET | ORAL | Status: AC

## 2014-09-07 NOTE — Progress Notes (Signed)
   Subjective:    Patient ID: Sheila Gray, female    DOB: 01/17/1951, 64 y.o.   MRN: [id number]  HPI  Patient returns for f/u of c/o LBP and left ischial pain/soreness. She had neg X rays and neg blood tests to r/o autoimmune/connective tissue dz. She was treated empirically with a pulse/taper of prednisone and had good response , but persists with lesser LBP and Left butt pain.   Medication Sig  .  (CALCIUM + D Take by mouth daily.  Marland Kitchen VITAMIN D Take 4,000 Units by mouth daily.  . fexofenadine  180 MG tablet Take 180 mg by mouth daily.  . Ibuprofen 200 MG CAPS Take by mouth. 2 at bedtime prn  . MULTIVITAMIN Take 1 tablet by mouth daily.  Marland Kitchen PROBIOTIC DAILY  Take by mouth.  . NON FORMULARY Allergy shot every 2 weeks   No Known Allergies   Past Medical History  Diagnosis Date  . Allergy   . Osteoporosis   . Vitamin D deficiency    Review of Systems  10 point systems review negative except as above.    Objective:   Physical Exam  BP 112/66 mmHg  Pulse 64  Temp(Src) 97 F (36.1 C)  Resp 16  Ht 5\' 2"  (1.575 m)  Wt 113 lb 9.6 oz (51.529 kg)  BMI 20.77 kg/m2  HEENT - Eac's patent. TM's Nl. EOM's full. PERRLA. NasoOroPharynx clear. Neck - supple. Nl Thyroid. Chest - Clear  Cor - Nl HS. RRR. No edema. MS- FROM w/o deformities. Muscle power, tone and bulk Nl. Gait Nl.still (+) tender at SI jts bilat and Left ischial bursae to pressure palpitation. Neuro - No obvious Cr N abnormalities. Sensory, motor and Cerebellar functions appear Nl w/o focal abnormalities. Psyche - Mental status normal & appropriate.      Assessment & Plan:   1. back pain  - meloxicam (MOBIC) 15 MG tablet; Take 1/2 to 1 tablet daily with food for pain & inflammation  Dispense: 90 tablet; Refill: 99  - discussed meds/SE's and to d/c other OTC NSAID's.

## 2014-09-07 NOTE — Patient Instructions (Addendum)
Back Pain, Adult °Low back pain is very common. About 1 in 5 people have back pain. The cause of low back pain is rarely dangerous. The pain often gets better over time. About half of people with a sudden onset of back pain feel better in just 2 weeks. About 8 in 10 people feel better by 6 weeks.  °CAUSES °Some common causes of back pain include: °· Strain of the muscles or ligaments supporting the spine. °· Wear and tear (degeneration) of the spinal discs. °· Arthritis. °· Direct injury to the back. °DIAGNOSIS °Most of the time, the direct cause of low back pain is not known. However, back pain can be treated effectively even when the exact cause of the pain is unknown. Answering your caregiver's questions about your overall health and symptoms is one of the most accurate ways to make sure the cause of your pain is not dangerous. If your caregiver needs more information, he or she may order lab work or imaging tests (X-rays or MRIs). However, even if imaging tests show changes in your back, this usually does not require surgery. °HOME CARE INSTRUCTIONS °For many people, back pain returns. Since low back pain is rarely dangerous, it is often a condition that people can learn to manage on their own.  °· Remain active. It is stressful on the back to sit or stand in one place. Do not sit, drive, or stand in one place for more than 30 minutes at a time. Take short walks on level surfaces as soon as pain allows. Try to increase the length of time you walk each day. °· Do not stay in bed. Resting more than 1 or 2 days can delay your recovery. °· Do not avoid exercise or work. Your body is made to move. It is not dangerous to be active, even though your back may hurt. Your back will likely heal faster if you return to being active before your pain is gone. °· Pay attention to your body when you  bend and lift. Many people have less discomfort when lifting if they bend their knees, keep the load close to their bodies, and  avoid twisting. Often, the most comfortable positions are those that put less stress on your recovering back. °· Find a comfortable position to sleep. Use a firm mattress and lie on your side with your knees slightly bent. If you lie on your back, put a pillow under your knees. °· Only take over-the-counter or prescription medicines as directed by your caregiver. Over-the-counter medicines to reduce pain and inflammation are often the most helpful. Your caregiver may prescribe muscle relaxant drugs. These medicines help dull your pain so you can more quickly return to your normal activities and healthy exercise. °· Put ice on the injured area. °· Put ice in a plastic bag. °· Place a towel between your skin and the bag. °· Leave the ice on for 15-20 minutes, 03-04 times a day for the first 2 to 3 days. After that, ice and heat may be alternated to reduce pain and spasms. °· Ask your caregiver about trying back exercises and gentle massage. This may be of some benefit. °· Avoid feeling anxious or stressed. Stress increases muscle tension and can worsen back pain. It is important to recognize when you are anxious or stressed and learn ways to manage it. Exercise is a great option. °SEEK MEDICAL CARE IF: °· You have pain that is not relieved with rest or medicine. °· You have pain that does not improve in 1 week. °· You have new symptoms. °· You are generally not feeling well. °SEEK   IMMEDIATE MEDICAL CARE IF:   You have pain that radiates from your back into your legs.  You develop new bowel or bladder control problems.  You have unusual weakness or numbness in your arms or legs.  You develop nausea or vomiting.  You develop abdominal pain.  You feel faint.  __________________________________________  Back Exercises Back exercises help treat and prevent back injuries. The goal is to increase your strength in your belly (abdominal) and back muscles. These exercises can also help with flexibility. Start  these exercises when told by your doctor. HOME CARE Back exercises include: Pelvic Tilt.  Lie on your back with your knees bent. Tilt your pelvis until the lower part of your back is against the floor. Hold this position 5 to 10 sec. Repeat this exercise 5 to 10 times. Knee to Chest.  Pull 1 knee up against your chest and hold for 20 to 30 seconds. Repeat this with the other knee. This may be done with the other leg straight or bent, whichever feels better. Then, pull both knees up against your chest. Sit-Ups or Curl-Ups.  Bend your knees 90 degrees. Start with tilting your pelvis, and do a partial, slow sit-up. Only lift your upper half 30 to 45 degrees off the floor. Take at least 2 to 3 seonds for each sit-up. Do not do sit-ups with your knees out straight. If partial sit-ups are difficult, simply do the above but with only tightening your belly (abdominal) muscles and holding it as told. Hip-Lift.  Lie on your back with your knees flexed 90 degrees. Push down with your feet and shoulders as you raise your hips 2 inches off the floor. Hold for 10 seconds, repeat 5 to 10 times. Back Arches.  Lie on your stomach. Prop yourself up on bent elbows. Slowly press on your hands, causing an arch in your low back. Repeat 3 to 5 times. Shoulder-Lifts.  Lie face down with arms beside your body. Keep hips and belly pressed to floor as you slowly lift your head and shoulders off the floor. Do not overdo your exercises. Be careful in the beginning. Exercises may cause you some mild back discomfort. If the pain lasts for more than 15 minutes, stop the exercises until you see your doctor. Improvement with exercise for back problems is slow.    ++++++++++++++++++++++++++++++++++++++++  Back Exercises Back exercises help treat and prevent back injuries. The goal of back exercises is to increase the strength of your abdominal and back muscles and the flexibility of your back. These exercises should be  started when you no longer have back pain. Back exercises include:  Pelvic Tilt. Lie on your back with your knees bent. Tilt your pelvis until the lower part of your back is against the floor. Hold this position 5 to 10 sec and repeat 5 to 10 times.  Knee to Chest. Pull first 1 knee up against your chest and hold for 20 to 30 seconds, repeat this with the other knee, and then both knees. This may be done with the other leg straight or bent, whichever feels better.  Sit-Ups or Curl-Ups. Bend your knees 90 degrees. Start with tilting your pelvis, and do a partial, slow sit-up, lifting your trunk only 30 to 45 degrees off the floor. Take at least 2 to 3 seconds for each sit-up. Do not do sit-ups with your knees out straight. If partial sit-ups are difficult, simply do the above but with only tightening your abdominal muscles and holding  it as directed.  Hip-Lift. Lie on your back with your knees flexed 90 degrees. Push down with your feet and shoulders as you raise your hips a couple inches off the floor; hold for 10 seconds, repeat 5 to 10 times.  Back arches. Lie on your stomach, propping yourself up on bent elbows. Slowly press on your hands, causing an arch in your low back. Repeat 3 to 5 times. Any initial stiffness and discomfort should lessen with repetition over time.  Shoulder-Lifts. Lie face down with arms beside your body. Keep hips and torso pressed to floor as you slowly lift your head and shoulders off the floor. Do not overdo your exercises, especially in the beginning. Exercises may cause you some mild back discomfort which lasts for a few minutes; however, if the pain is more severe, or lasts for more than 15 minutes, do not continue exercises until you see your caregiver. Improvement with exercise therapy for back problems is slow.  See your caregivers for assistance with developing a proper back exercise program.   ++++++++++++++++++++++++++++++++++++  Back Injury Prevention Back  injuries can be extremely painful and difficult to heal. After having one back injury, you are much more likely to experience another later on. It is important to learn how to avoid injuring or re-injuring your back. The following tips can help you to prevent a back injury. PHYSICAL FITNESS  Exercise regularly and try to develop good tone in your abdominal muscles. Your abdominal muscles provide a lot of the support needed by your back.  Do aerobic exercises (walking, jogging, biking, swimming) regularly.  Do exercises that increase balance and strength (tai chi, yoga) regularly. This can decrease your risk of falling and injuring your back.  Stretch before and after exercising.  Maintain a healthy weight. The more you weigh, the more stress is placed on your back. For every pound of weight, 10 times that amount of pressure is placed on the back. DIET  Talk to your caregiver about how much calcium and vitamin D you need per day. These nutrients help to prevent weakening of the bones (osteoporosis). Osteoporosis can cause broken (fractured) bones that lead to back pain.  Include good sources of calcium in your diet, such as dairy products, green, leafy vegetables, and products with calcium added (fortified).  Include good sources of vitamin D in your diet, such as milk and foods that are fortified with vitamin D.  Consider taking a nutritional supplement or a multivitamin if needed.  Stop smoking if you smoke. POSTURE  Sit and stand up straight. Avoid leaning forward when you sit or hunching over when you stand.  Choose chairs with good low back (lumbar) support.  If you work at a desk, sit close to your work so you do not need to lean over. Keep your chin tucked in. Keep your neck drawn back and elbows bent at a right angle. Your arms should look like the letter "L."  Sit high and close to the steering wheel when you drive. Add a lumbar support to your car seat if needed.  Avoid  sitting or standing in one position for too long. Take breaks to get up, stretch, and walk around at least once every hour. Take breaks if you are driving for long periods of time.  Sleep on your side with your knees slightly bent, or sleep on your back with a pillow under your knees. Do not sleep on your stomach. LIFTING, TWISTING, AND REACHING  Avoid heavy lifting,  especially repetitive lifting. If you must do heavy lifting:  Stretch before lifting.  Work slowly.  Rest between lifts.  Use carts and dollies to move objects when possible.  Make several small trips instead of carrying 1 heavy load.  Ask for help when you need it.  Ask for help when moving big, awkward objects.  Follow these steps when lifting:  Stand with your feet shoulder-width apart.  Get as close to the object as you can. Do not try to pick up heavy objects that are far from your body.  Use handles or lifting straps if they are available.  Bend at your knees. Squat down, but keep your heels off the floor.  Keep your shoulders pulled back, your chin tucked in, and your back straight.  Lift the object slowly, tightening the muscles in your legs, abdomen, and buttocks. Keep the object as close to the center of your body as possible.  When you put a load down, use these same guidelines in reverse.  Do not:  Lift the object above your waist.  Twist at the waist while lifting or carrying a load. Move your feet if you need to turn, not your waist.  Bend over without bending at your knees.  Avoid reaching over your head, across a table, or for an object on a high surface. OTHER TIPS  Avoid wet floors and keep sidewalks clear of ice to prevent falls.  Do not sleep on a mattress that is too soft or too hard.  Keep items that are used frequently within easy reach.  Put heavier objects on shelves at waist level and lighter objects on lower or higher shelves.  Find ways to decrease your stress, such as  exercise, massage, or relaxation techniques. Stress can build up in your muscles. Tense muscles are more vulnerable to injury.  Seek treatment for depression or anxiety if needed. These conditions can increase your risk of developing back pain. SEEK MEDICAL CARE IF:  You injure your back.  You have questions about diet, exercise, or other ways to prevent back injuries. MAKE SURE YOU:  Understand these instructions.  Will watch your condition.  Will get help right away if you are not doing well or get worse. Document Released: 04/09/2004 Document Revised: 05/25/2011 Document Reviewed: 04/13/2011 [facility] Patient Information 2015 [place], [place]. This information is not intended to replace advice given to you by your health care provider. Make sure you discuss any questions you have with your health care provider.

## 2014-10-12 ENCOUNTER — Telehealth: Payer: Self-pay | Admitting: Internal Medicine

## 2014-10-12 NOTE — Telephone Encounter (Signed)
Patient called to have an order for her DEXA @ [facility] on  11-09-14 Faxed/ fax order.  Thank you, Leonie Douglas Referral Coordinator  [facility] [phone] ext. [phone] Fax [phone]

## 2014-11-09 DIAGNOSIS — M810 Age-related osteoporosis without current pathological fracture: Secondary | ICD-10-CM | POA: Diagnosis not present

## 2014-11-13 ENCOUNTER — Ambulatory Visit (INDEPENDENT_AMBULATORY_CARE_PROVIDER_SITE_OTHER): Payer: Medicare Other | Admitting: Internal Medicine

## 2014-11-13 ENCOUNTER — Encounter: Payer: Self-pay | Admitting: Internal Medicine

## 2014-11-13 VITALS — BP 102/70 | HR 76 | Temp 98.2°F | Resp 16 | Ht 62.0 in | Wt 114.0 lb

## 2014-11-13 DIAGNOSIS — Z79899 Other long term (current) drug therapy: Secondary | ICD-10-CM | POA: Diagnosis not present

## 2014-11-13 DIAGNOSIS — Z0001 Encounter for general adult medical examination with abnormal findings: Secondary | ICD-10-CM

## 2014-11-13 DIAGNOSIS — E559 Vitamin D deficiency, unspecified: Secondary | ICD-10-CM | POA: Diagnosis not present

## 2014-11-13 DIAGNOSIS — M810 Age-related osteoporosis without current pathological fracture: Secondary | ICD-10-CM | POA: Diagnosis not present

## 2014-11-13 DIAGNOSIS — M5440 Lumbago with sciatica, unspecified side: Secondary | ICD-10-CM | POA: Diagnosis not present

## 2014-11-13 DIAGNOSIS — E538 Deficiency of other specified B group vitamins: Secondary | ICD-10-CM

## 2014-11-13 DIAGNOSIS — R030 Elevated blood-pressure reading, without diagnosis of hypertension: Secondary | ICD-10-CM | POA: Diagnosis not present

## 2014-11-13 DIAGNOSIS — IMO0001 Reserved for inherently not codable concepts without codable children: Secondary | ICD-10-CM

## 2014-11-13 DIAGNOSIS — Z6820 Body mass index (BMI) 20.0-20.9, adult: Secondary | ICD-10-CM

## 2014-11-13 DIAGNOSIS — R7309 Other abnormal glucose: Secondary | ICD-10-CM | POA: Diagnosis not present

## 2014-11-13 DIAGNOSIS — R6889 Other general symptoms and signs: Secondary | ICD-10-CM

## 2014-11-13 DIAGNOSIS — E7800 Pure hypercholesterolemia, unspecified: Secondary | ICD-10-CM

## 2014-11-13 DIAGNOSIS — D509 Iron deficiency anemia, unspecified: Secondary | ICD-10-CM

## 2014-11-13 DIAGNOSIS — Z1212 Encounter for screening for malignant neoplasm of rectum: Secondary | ICD-10-CM

## 2014-11-13 LAB — TSH: TSH: 4.099 u[IU]/mL (ref 0.350–4.500)

## 2014-11-13 LAB — HEPATIC FUNCTION PANEL
ALT: 12 U/L (ref 6–29)
AST: 17 U/L (ref 10–35)
Albumin: 4.4 g/dL (ref 3.6–5.1)
Alkaline Phosphatase: 43 U/L (ref 33–130)
BILIRUBIN INDIRECT: 0.6 mg/dL (ref 0.2–1.2)
Bilirubin, Direct: 0.1 mg/dL (ref <=0.2)
TOTAL PROTEIN: 7 g/dL (ref 6.1–8.1)
Total Bilirubin: 0.7 mg/dL (ref 0.2–1.2)

## 2014-11-13 LAB — LIPID PANEL
CHOLESTEROL: 215 mg/dL — AB (ref 125–200)
HDL: 111 mg/dL (ref >=46)
LDL Cholesterol: 81 mg/dL (ref <130)
TRIGLYCERIDES: 115 mg/dL (ref <150)
Total CHOL/HDL Ratio: 1.9 Ratio (ref <=5.0)
VLDL: 23 mg/dL (ref <30)

## 2014-11-13 LAB — BASIC METABOLIC PANEL WITH GFR
BUN: 15 mg/dL (ref 7–25)
CHLORIDE: 101 mmol/L (ref 98–110)
CO2: 30 mmol/L (ref 20–31)
Calcium: 10.1 mg/dL (ref 8.6–10.4)
Creat: 0.89 mg/dL (ref 0.50–0.99)
GFR, EST AFRICAN AMERICAN: 79 mL/min (ref >=60)
GFR, EST NON AFRICAN AMERICAN: 68 mL/min (ref >=60)
GLUCOSE: 85 mg/dL (ref 65–99)
POTASSIUM: 4 mmol/L (ref 3.5–5.3)
Sodium: 140 mmol/L (ref 135–146)

## 2014-11-13 LAB — HEMOGLOBIN A1C
HEMOGLOBIN A1C: 5 % (ref <5.7)
MEAN PLASMA GLUCOSE: 97 mg/dL (ref <117)

## 2014-11-13 LAB — CBC WITH DIFFERENTIAL/PLATELET
BASOS ABS: 0.1 10*3/uL (ref 0.0–0.1)
Basophils Relative: 1 % (ref 0–1)
Eosinophils Absolute: 0.1 10*3/uL (ref 0.0–0.7)
Eosinophils Relative: 2 % (ref 0–5)
HEMATOCRIT: 43 % (ref 36.0–46.0)
HEMOGLOBIN: 14.8 g/dL (ref 12.0–15.0)
LYMPHS ABS: 1.8 10*3/uL (ref 0.7–4.0)
LYMPHS PCT: 28 % (ref 12–46)
MCH: 31.1 pg (ref 26.0–34.0)
MCHC: 34.4 g/dL (ref 30.0–36.0)
MCV: 90.3 fL (ref 78.0–100.0)
MPV: 8.7 fL (ref 8.6–12.4)
Monocytes Absolute: 0.5 10*3/uL (ref 0.1–1.0)
Monocytes Relative: 8 % (ref 3–12)
NEUTROS PCT: 61 % (ref 43–77)
Neutro Abs: 3.9 10*3/uL (ref 1.7–7.7)
Platelets: 230 10*3/uL (ref 150–400)
RBC: 4.76 MIL/uL (ref 3.87–5.11)
RDW: 13.9 % (ref 11.5–15.5)
WBC: 6.4 10*3/uL (ref 4.0–10.5)

## 2014-11-13 LAB — MAGNESIUM: Magnesium: 2.2 mg/dL (ref 1.5–2.5)

## 2014-11-13 LAB — VITAMIN B12: Vitamin B-12: 508 pg/mL (ref 211–911)

## 2014-11-13 LAB — IRON AND TIBC
%SAT: 36 % (ref 11–50)
Iron: 122 ug/dL (ref 45–160)
TIBC: 340 ug/dL (ref 250–450)
UIBC: 218 ug/dL (ref 125–400)

## 2014-11-13 NOTE — Patient Instructions (Signed)
Recommend Adult Low dose Aspirin or   coated  Aspirin 81 mg daily   To reduce risk of Colon Cancer 20 %,   Skin Cancer 26 % ,   Melanoma 46%   and   Pancreatic cancer 60%  ++++++++++++++++++  Vitamin D goal   is between 70-100.   Please make sure that you are taking your Vitamin D as directed.   It is very important as a natural anti-inflammatory   helping hair, skin, and nails, as well as reducing stroke and heart attack risk.   It helps your bones and helps with mood.  It also decreases numerous cancer risks so please take it as directed.   Low Vit D is associated with a 200-300% higher risk for CANCER   and 200-300% higher risk for HEART   ATTACK  &  STROKE.   ......................................  It is also associated with higher death rate at younger ages,   autoimmune diseases like Rheumatoid arthritis, Lupus, Multiple Sclerosis.     Also many other serious conditions, like depression, Alzheimer's  Dementia, infertility, muscle aches, fatigue, fibromyalgia - just to name a few.  +++++++++++++++++++  Recommend the book "The END of DIETING" by Dr Joel Fuhrman   & the book "The END of DIABETES " by Dr Joel Fuhrman  At [employer] - get book & Audio CD's     Being diabetic has a  300% increased risk for heart attack, stroke, cancer, and alzheimer- type vascular dementia. It is very important that you work harder with diet by avoiding all foods that are white. Avoid white rice (brown & wild rice is OK), white potatoes (sweetpotatoes in moderation is OK), White bread or wheat bread or anything made out of white flour like bagels, donuts, rolls, buns, biscuits, cakes, pastries, cookies, pizza crust, and pasta (made from white flour & egg whites) - vegetarian pasta or spinach or wheat pasta is OK. Multigrain breads like Arnold's or [employer], or multigrain sandwich thins or flatbreads.  Diet, exercise and weight loss can reverse and cure diabetes in the early  stages.  Diet, exercise and weight loss is very important in the control and prevention of complications of diabetes which affects every system in your body, ie. Brain - dementia/stroke, eyes - glaucoma/blindness, heart - heart attack/heart failure, kidneys - dialysis, stomach - gastric paralysis, intestines - malabsorption, nerves - severe painful neuritis, circulation - gangrene & loss of a leg(s), and finally cancer and Alzheimers.    I recommend avoid fried & greasy foods,  sweets/candy, white rice (brown or wild rice or Quinoa is OK), white potatoes (sweet potatoes are OK) - anything made from white flour - bagels, doughnuts, rolls, buns, biscuits,white and wheat breads, pizza crust and traditional pasta made of white flour & egg white(vegetarian pasta or spinach or wheat pasta is OK).  Multi-grain bread is OK - like multi-grain flat bread or sandwich thins. Avoid alcohol in excess. Exercise is also important.    Eat all the vegetables you want - avoid meat, especially red meat and dairy - especially cheese.  Cheese is the most concentrated form of trans-fats which is the worst thing to clog up our arteries. Veggie cheese is OK which can be found in the fresh produce section at [employer] or [employer] or [employer]  ++++++++++++++++++++++++++   Preventive Care for Adults  A healthy lifestyle and preventive care can promote health and wellness. Preventive health guidelines for women include the following key practices.  A routine   to check with your health care provider about your health and preventive screening. It is a chance to share any concerns and updates on your health and to receive a thorough exam.  Visit your dentist for a routine exam and preventive care every 6 months. Brush your teeth twice a day and floss once a day. Good oral hygiene prevents tooth decay and gum disease.  The frequency of eye exams is based on your age, health, family medical history, use  of contact lenses, and other factors. Follow your health care provider's recommendations for frequency of eye exams.  Eat a healthy diet. Foods like vegetables, fruits, whole grains, low-fat dairy products, and lean protein foods contain the nutrients you need without too many calories. Decrease your intake of foods high in solid fats, added sugars, and salt. Eat the right amount of calories for you.Get information about a proper diet from your health care provider, if necessary.  Regular physical exercise is one of the most important things you can do for your health. Most adults should get at least 150 minutes of moderate-intensity exercise (any activity that increases your heart rate and causes you to sweat) each week. In addition, most adults need muscle-strengthening exercises on 2 or more days a week.  Maintain a healthy weight. The body mass index (BMI) is a screening tool to identify possible weight problems. It provides an estimate of body fat based on height and weight. Your health care provider can find your BMI and can help you achieve or maintain a healthy weight.For adults 20 years and older:  A BMI below 18.5 is considered underweight.  A BMI of 18.5 to 24.9 is normal.  A BMI of 25 to 29.9 is considered overweight.  A BMI of 30 and above is considered obese.  Maintain normal blood lipids and cholesterol levels by exercising and minimizing your intake of saturated fat. Eat a balanced diet with plenty of fruit and vegetables. If your lipid or cholesterol levels are high, you are over 50, or you are at high risk for heart disease, you may need your cholesterol levels checked more frequently.Ongoing high lipid and cholesterol levels should be treated with medicines if diet and exercise are not working.  If you smoke, find out from your health care provider how to quit. If you do not use tobacco, do not start.  Lung cancer screening is recommended for adults aged 55-80 years who are  at high risk for developing lung cancer because of a history of smoking. A yearly low-dose CT scan of the lungs is recommended for people who have at least a 30-pack-year history of smoking and are a current smoker or have quit within the past 15 years. A pack year of smoking is smoking an average of 1 pack of cigarettes a day for 1 year (for example: 1 pack a day for 30 years or 2 packs a day for 15 years). Yearly screening should continue until the smoker has stopped smoking for at least 15 years. Yearly screening should be stopped for people who develop a health problem that would prevent them from having lung cancer treatment.  Avoid use of street drugs. Do not share needles with anyone. Ask for help if you need support or instructions about stopping the use of drugs.  High blood pressure causes heart disease and increases the risk of stroke.  Ongoing high blood pressure should be treated with medicines if weight loss and exercise do not work.  If you are 55-79   years old, ask your health care provider if you should take aspirin to prevent strokes.  Diabetes screening involves taking a blood sample to check your fasting blood sugar level. This should be done once every 3 years, after age 45, if you are within normal weight and without risk factors for diabetes. Testing should be considered at a younger age or be carried out more frequently if you are overweight and have at least 1 risk factor for diabetes.  Breast cancer screening is essential preventive care for women. You should practice "breast self-awareness." This means understanding the normal appearance and feel of your breasts and may include breast self-examination. Any changes detected, no matter how small, should be reported to a health care provider. Women in their 20s and 30s should have a clinical breast exam (CBE) by a health care provider as part of a regular health exam every 1 to 3 years. After age 40, women should have a CBE every  year. Starting at age 40, women should consider having a mammogram (breast X-ray test) every year. Women who have a family history of breast cancer should talk to their health care provider about genetic screening. Women at a high risk of breast cancer should talk to their health care providers about having an MRI and a mammogram every year.  Breast cancer gene (BRCA)-related cancer risk assessment is recommended for women who have family members with BRCA-related cancers. BRCA-related cancers include breast, ovarian, tubal, and peritoneal cancers. Having family members with these cancers may be associated with an increased risk for harmful changes (mutations) in the breast cancer genes BRCA1 and BRCA2. Results of the assessment will determine the need for genetic counseling and BRCA1 and BRCA2 testing.  Routine pelvic exams to screen for cancer are no longer recommended for nonpregnant women who are considered low risk for cancer of the pelvic organs (ovaries, uterus, and vagina) and who do not have symptoms. Ask your health care provider if a screening pelvic exam is right for you.  If you have had past treatment for cervical cancer or a condition that could lead to cancer, you need Pap tests and screening for cancer for at least 20 years after your treatment. If Pap tests have been discontinued, your risk factors (such as having a new sexual partner) need to be reassessed to determine if screening should be resumed. Some women have medical problems that increase the chance of getting cervical cancer. In these cases, your health care provider may recommend more frequent screening and Pap tests.    Colorectal cancer can be detected and often prevented. Most routine colorectal cancer screening begins at the age of 50 years and continues through age 75 years. However, your health care provider may recommend screening at an earlier age if you have risk factors for colon cancer. On a yearly basis, your health  care provider may provide home test kits to check for hidden blood in the stool. Use of a small camera at the end of a tube, to directly examine the colon (sigmoidoscopy or colonoscopy), can detect the earliest forms of colorectal cancer. Talk to your health care provider about this at age 50, when routine screening begins. Direct exam of the colon should be repeated every 5-10 years through age 75 years, unless early forms of pre-cancerous polyps or small growths are found.  Osteoporosis is a disease in which the bones lose minerals and strength with aging. This can result in serious bone fractures or breaks. The risk of osteoporosis   can be identified using a bone density scan. Women ages 65 years and over and women at risk for fractures or osteoporosis should discuss screening with their health care providers. Ask your health care provider whether you should take a calcium supplement or vitamin D to reduce the rate of osteoporosis.  Menopause can be associated with physical symptoms and risks. Hormone replacement therapy is available to decrease symptoms and risks. You should talk to your health care provider about whether hormone replacement therapy is right for you.  Use sunscreen. Apply sunscreen liberally and repeatedly throughout the day. You should seek shade when your shadow is shorter than you. Protect yourself by wearing long sleeves, pants, a wide-brimmed hat, and sunglasses year round, whenever you are outdoors.  Once a month, do a whole body skin exam, using a mirror to look at the skin on your back. Tell your health care provider of new moles, moles that have irregular borders, moles that are larger than a pencil eraser, or moles that have changed in shape or color.  Stay current with required vaccines (immunizations).  Influenza vaccine. All adults should be immunized every year.  Tetanus, diphtheria, and acellular pertussis (Td, Tdap) vaccine. Pregnant women should receive 1 dose of  Tdap vaccine during each pregnancy. The dose should be obtained regardless of the length of time since the last dose. Immunization is preferred during the 27th-36th week of gestation. An adult who has not previously received Tdap or who does not know her vaccine status should receive 1 dose of Tdap. This initial dose should be followed by tetanus and diphtheria toxoids (Td) booster doses every 10 years. Adults with an unknown or incomplete history of completing a 3-dose immunization series with Td-containing vaccines should begin or complete a primary immunization series including a Tdap dose. Adults should receive a Td booster every 10 years.    Zoster vaccine. One dose is recommended for adults aged 60 years or older unless certain conditions are present.    Pneumococcal 13-valent conjugate (PCV13) vaccine. When indicated, a person who is uncertain of her immunization history and has no record of immunization should receive the PCV13 vaccine. An adult aged 19 years or older who has certain medical conditions and has not been previously immunized should receive 1 dose of PCV13 vaccine. This PCV13 should be followed with a dose of pneumococcal polysaccharide (PPSV23) vaccine. The PPSV23 vaccine dose should be obtained at least 8 weeks after the dose of PCV13 vaccine. An adult aged 19 years or older who has certain medical conditions and previously received 1 or more doses of PPSV23 vaccine should receive 1 dose of PCV13. The PCV13 vaccine dose should be obtained 1 or more years after the last PPSV23 vaccine dose.    Pneumococcal polysaccharide (PPSV23) vaccine. When PCV13 is also indicated, PCV13 should be obtained first. All adults aged 65 years and older should be immunized. An adult younger than age 65 years who has certain medical conditions should be immunized. Any person who resides in a nursing home or long-term care facility should be immunized. An adult smoker should be immunized. People with an  immunocompromised condition and certain other conditions should receive both PCV13 and PPSV23 vaccines. People with human immunodeficiency virus (HIV) infection should be immunized as soon as possible after diagnosis. Immunization during chemotherapy or radiation therapy should be avoided. Routine use of PPSV23 vaccine is not recommended for American Indians, [place] Natives, or people younger than 65 years unless there are medical conditions that require   PPSV23 vaccine. When indicated, people who have unknown immunization and have no record of immunization should receive PPSV23 vaccine. One-time revaccination 5 years after the first dose of PPSV23 is recommended for people aged 19-64 years who have chronic kidney failure, nephrotic syndrome, asplenia, or immunocompromised conditions. People who received 1-2 doses of PPSV23 before age 65 years should receive another dose of PPSV23 vaccine at age 65 years or later if at least 5 years have passed since the previous dose. Doses of PPSV23 are not needed for people immunized with PPSV23 at or after age 65 years.   Preventive Services / Frequency  Ages 65 years and over  Blood pressure check.  Lipid and cholesterol check.  Lung cancer screening. / Every year if you are aged 55-80 years and have a 30-pack-year history of smoking and currently smoke or have quit within the past 15 years. Yearly screening is stopped once you have quit smoking for at least 15 years or develop a health problem that would prevent you from having lung cancer treatment.  Clinical breast exam.** / Every year after age 40 years.  BRCA-related cancer risk assessment.** / For women who have family members with a BRCA-related cancer (breast, ovarian, tubal, or peritoneal cancers).  Mammogram.** / Every year beginning at age 40 years and continuing for as long as you are in good health. Consult with your health care provider.  Pap test.** / Every 3 years starting at age 30 years  through age 65 or 70 years with 3 consecutive normal Pap tests. Testing can be stopped between 65 and 70 years with 3 consecutive normal Pap tests and no abnormal Pap or HPV tests in the past 10 years.  Fecal occult blood test (FOBT) of stool. / Every year beginning at age 50 years and continuing until age 75 years. You may not need to do this test if you get a colonoscopy every 10 years.  Flexible sigmoidoscopy or colonoscopy.** / Every 5 years for a flexible sigmoidoscopy or every 10 years for a colonoscopy beginning at age 50 years and continuing until age 75 years.  Hepatitis C blood test.** / For all people born from 1945 through 1965 and any individual with known risks for hepatitis C.  Osteoporosis screening.** / A one-time screening for women ages 65 years and over and women at risk for fractures or osteoporosis.  Skin self-exam. / Monthly.  Influenza vaccine. / Every year.  Tetanus, diphtheria, and acellular pertussis (Tdap/Td) vaccine.** / 1 dose of Td every 10 years.  Zoster vaccine.** / 1 dose for adults aged 60 years or older.  Pneumococcal 13-valent conjugate (PCV13) vaccine.** / Consult your health care provider.  Pneumococcal polysaccharide (PPSV23) vaccine.** / 1 dose for all adults aged 65 years and older. Screening for abdominal aortic aneurysm (AAA)  by ultrasound is recommended for people who have history of high blood pressure or who are current or former smokers. 

## 2014-11-13 NOTE — Progress Notes (Signed)
Patient ID: Sheila Gray, female   DOB: April 26, 1950, 64 y.o.   MRN: [id number]  MEDICARE ANNUAL WELLNESS VISIT AND CPE  Assessment:   1. Elevated BP  - EKG 12-Lead  2. Elevated cholesterol  - Lipid panel - TSH  3. Abnormal glucose  - Hemoglobin A1c - Insulin, random  4. Vitamin D deficiency  - Vit D  25 hydroxy   5. Osteoporosis / Osteopenia   6. Screening for rectal cancer  - POC Hemoccult Bld/Stl   7. Vitamin B 12 deficiency  - Vitamin B12  8. Anemia, iron deficiency  - Iron and TIBC  9. Encounter for general adult medical examination with abnormal findings   10. Midline low back pain with sciatica, r/o DDD &/or SS  - Ambulatory referral to Orthopedic Surgery - Dr Durward Fortes  11. Medication management  - Urine Microscopic; Future - CBC with Differential/Platelet - BASIC METABOLIC PANEL WITH GFR - Hepatic function panel - Magnesium  12. Body mass index (BMI) of 20.0-20.9     Plan:   During the course of the visit the patient was educated and counseled about appropriate screening and preventive services including:    Pneumococcal vaccine   Influenza vaccine  Td vaccine  Screening electrocardiogram  Bone densitometry screening  Colorectal cancer screening  Diabetes screening  Glaucoma screening  Nutrition counseling   Advanced directives: requested  Screening recommendations, referrals: Vaccinations:  Immunization History  Administered Date(s) Administered  . PPD Test 11/09/2013  . Pneumococcal Conjugate-13 03/29/2014  . Td 05/14/2005  . Zoster 11/28/2009  Pneumococcal vaccine deferred 6 months since [facility] til Jan 2017. Hep B vaccine not indicated  Nutrition assessed and recommended  Colonoscopy 11/26/2009 Recommended yearly ophthalmology/optometry visit for glaucoma screening and checkup Recommended yearly dental visit for hygiene and checkup Advanced directives - yes  Conditions/risks identified: BMI: Discussed weight  loss, diet, and increase physical activity.  Increase physical activity: AHA recommends 150 minutes of physical activity a week.  Medications reviewed Blood sugars are at goal, ACE/ARB therapy: Not indicated Urinary Incontinence is not an issue: discussed non pharmacology and pharmacology options.  Fall risk: low- discussed PT, home fall assessment, medications.   Subjective:    Sheila Gray  presents for her Welcome to Medicare Visit and also her annual complete physical.  This very nice 64 y.o. MWF presents proactively and expectantly for monitoring for elevated BP, abnormal lipids, glucose  and Vitamin D Deficiency. 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 LBP w/sciatica persists.    Patient's BP has been controlled and today's BP: 102/70 mmHg. Patient has had no complaints of any cardiac type chest pain, palpitations, dyspnea/orthopnea/PND, dizziness, claudication, or dependent edema.   Hyperlipidemia is controlled with diet & meds. Patient denies myalgias or other med SE's. Last Lipids were elevated with elevated total Chol 236  because of a very high HDL of 105 and LDL was slightly elevated also. Triglycerides were Nl at 105.    Also, the patient is monitored for glucose intolerance  and has had no symptoms of reactive hypoglycemia, diabetic polys, paresthesias or visual blurring.  Last A1c was 5.5% in Aug 2015.    Previous BMD's have shown osteoporosis and with treatment with Actonel her numbers improved significantly back to the osteopenia range.  Most recent BMD was done 5 days ago with results pending. Further, the patient also has history of Vitamin D Deficiency and supplements vitamin D  without any suspected side-effects. Last vitamin D was 60 in Aug 2015.    Names of Other Physician/Practitioners you currently use: 1.  [facility] here for primary care 2. Dr  Delman Cheadle, eye doctor, last visit 2016 3. Dr Viviann Spare, dentist, last visit 2 weeks ago  Patient Care Team: Unk Pinto, MD as PCP - General (Internal Medicine) Inda Castle, MD as Consulting Physician (Gastroenterology) Lennon Alstrom, MD as Consulting Physician (Neurology) Freeman Caldron, MD (Rheumatology) Lavonna Monarch, MD as Consulting Physician (Dermatology) Garald Balding, MD as Consulting Physician (Orthopedic Surgery) Harle Battiest, MD as Consulting Physician (Obstetrics and Gynecology)  Medication Review: Medication Sig  . CALCIUM + D Take by mouth daily.  Marland Kitchen VITAMIN D  Take 4,000 Units by mouth daily.  . fexofenadine  180 MG tablet Take 180 mg by mouth daily.  . meloxicam 15 MG tablet Take 1/2 to 1 tablet daily with food for pain & inflammation  . Multiple Vitamin  Take 1 tablet by mouth daily.  . Probiotic  Take by mouth.   No Known Allergies  Current Problems (verified) Patient Active Problem List   Diagnosis Date Noted  . Allergic rhinitis 04/10/2013  . Osteoporosis 04/10/2013  . Vitamin D deficiency 04/10/2013    Screening Tests Health Maintenance  Topic Date Due  . DEXA SCAN  04/17/2014  . INFLUENZA VACCINE  10/15/2014  . PNA vac Low Risk Adult (2 of 2 - PPSV23) 03/30/2015  . TETANUS/TDAP  05/15/2015  . MAMMOGRAM  11/30/2015  . COLONOSCOPY  11/27/2019  . ZOSTAVAX  Completed  . Hepatitis C Screening  Completed  . HIV Screening  Completed    Immunization History  Administered Date(s) Administered  . PPD Test 11/09/2013  . Pneumococcal Conjugate-13 03/29/2014  . Td 05/14/2005  . Zoster 11/28/2009   Preventative care: Last colonoscopy: 11/27/2014  Past Medical History  Diagnosis Date  . Allergy   . Osteoporosis   . Vitamin D deficiency    No past surgical history on file.  Risk Factors: Tobacco Social History  Substance Use Topics  . Smoking status: Never Smoker   . Smokeless tobacco: Never Used  . Alcohol Use: 1.5 oz/week    3 drink(s)  per week   She does not smoke.  Patient is not a former smoker. Are there smokers in your home (other than you)?  No Alcohol Current alcohol use: social drinker  Caffeine Current caffeine use: coffee 1 cup /day  Exercise Current exercise: hiking, housecleaning, walking and yard work  Nutrition/Diet Current diet: in general, a "healthy" diet    Cardiac risk factors: advanced age (older than 7 for men, 65 for women) and dyslipidemia.  Depression Screen (Note: if answer to either of the following is "Yes", a more complete depression screening is indicated)   Q1: Over the past two weeks, have you felt down, depressed or hopeless? No  Q2: Over the past two weeks, have you felt little interest or pleasure in doing things? No  Have you lost interest or pleasure in daily life? No  Do you often feel hopeless? No  Do you cry easily over simple problems? No  Activities of Daily Living In your present state of health, do you have any difficulty performing the following activities?:  Driving? No Managing money?  No Feeding yourself? No Getting from bed to chair? No Climbing a flight of stairs? No Preparing food and eating?: No Bathing or showering? No Getting dressed: No Getting to the  toilet? No Using the toilet:No Moving around from place to place: No In the past year have you fallen or had a near fall?:No   Are you sexually active?  Yes  Do you have more than one partner?  No  Vision Difficulties: No  Hearing Difficulties: No Do you often ask people to speak up or repeat themselves? No Do you experience ringing or noises in your ears? No Do you have difficulty understanding soft or whispered voices? Sometimes.  Cognition  Do you feel that you have a problem with memory?No  Do you often misplace items? No  Do you feel safe at home?  Yes  Advanced directives Does patient have a Rogers? Yes Does patient have a Living Will?  Yes  ROS: Constitutional: Denies fever, chills, weight loss/gain, headaches, insomnia, fatigue, night sweats, and change in appetite. Eyes: Denies redness, blurred vision, diplopia, discharge, itchy, watery eyes.  ENT: Denies discharge, congestion, post nasal drip, epistaxis, sore throat, earache, hearing loss, dental pain, Tinnitus, Vertigo, Sinus pain, snoring.  Cardio: Denies chest pain, palpitations, irregular heartbeat, syncope, dyspnea, diaphoresis, orthopnea, PND, claudication, edema Respiratory: denies cough, dyspnea, DOE, pleurisy, hoarseness, laryngitis, wheezing.  Gastrointestinal: Denies dysphagia, heartburn, reflux, water brash, pain, cramps, nausea, vomiting, bloating, diarrhea, constipation, hematemesis, melena, hematochezia, jaundice, hemorrhoids Genitourinary: Denies dysuria, frequency, urgency, nocturia, hesitancy, discharge, hematuria, flank pain Breast: Breast lumps, nipple discharge, bleeding.  Musculoskeletal: Denies arthralgia, myalgia, stiffness, Jt. Swelling, pain, limp, and strain/sprain. Denies falls. Skin: Denies puritis, rash, hives, warts, acne, eczema, changing in skin lesion Neuro: No weakness, tremor, incoordination, spasms, paresthesia, pain Psychiatric: Denies confusion, memory loss, sensory loss. Denies Depression. Endocrine: Denies change in weight, skin, hair change, nocturia, and paresthesia, diabetic polys, visual blurring, hyper / hypo glycemic episodes.  Heme/Lymph: No excessive bleeding, bruising, enlarged lymph nodes  Objective:     BP 102/70 mmHg  Pulse 76  Temp(Src) 98.2 F (36.8 C) (Temporal)  Resp 16  Ht 5\' 2"  (1.575 m)  Wt 114 lb (51.71 kg)  BMI 20.85 kg/m2  General Appearance: Well nourished, alert, WD/WN, female and in no apparent distress. Eyes: PERRLA, EOMs, conjunctiva no swelling or erythema, normal fundi and vessels. Sinuses: No frontal/maxillary tenderness ENT/Mouth: EACs patent / TMs  nl. Nares clear without erythema, swelling,  mucoid exudates. Oral hygiene is good. No erythema, swelling, or exudate. Tongue normal, non-obstructing. Tonsils not swollen or erythematous. Hearing normal.  Neck: Supple, thyroid normal. No bruits, nodes or JVD. Respiratory: Respiratory effort normal.  BS equal and clear bilateral without rales, rhonci, wheezing or stridor. Cardio: Heart sounds are normal with regular rate and rhythm and no murmurs, rubs or gallops. Peripheral pulses are normal and equal bilaterally without edema. No aortic or femoral bruits. Chest: symmetric with normal excursions and percussion. Breasts: Deferred to GYN- Dr Melinda Crutch Abdomen: Flat, soft  with nl bowel sounds. Nontender, no guarding, rebound, hernias, masses, or organomegaly.  Lymphatics: Non tender without lymphadenopathy.  Genitourinary:  Musculoskeletal: Full ROM all peripheral extremities, joint stability, 5/5 strength, and normal gait. Skin: Warm and dry without rashes, lesions, cyanosis, clubbing or  ecchymosis.  Neuro: Cranial nerves intact, reflexes equal bilaterally. Normal muscle tone, no cerebellar symptoms. Sensation intact.  Pysch: Alert and oriented X 3, normal affect, Insight and Judgment appropriate.   Cognitive Testing  Alert? Yes  Normal Appearance?Yes  Oriented to person? Yes  Place? Yes   Time? Yes  Recall of three objects?  Yes  Can perform simple calculations? Yes  Displays  appropriate judgment? Yes  Can read the correct time from a watch/clock?Yes  Medicare Attestation I have personally reviewed: The patient's medical and social history Their use of alcohol, tobacco or illicit drugs Their current medications and supplements The patient's functional ability including ADLs,fall risks, home safety risks, cognitive, and hearing and visual impairment Diet and physical activities Evidence for depression or mood disorders  The patient's weight, height, BMI, and visual acuity have been recorded in the chart.  I have made referrals,  counseling, and provided education to the patient based on review of the above and I have provided the patient with a written personalized care plan for preventive services.  Over 40 minutes of exam, counseling, chart review was performed.  Kaelen Caughlin DAVID, MD   11/13/2014

## 2014-11-14 ENCOUNTER — Encounter: Payer: Self-pay | Admitting: Internal Medicine

## 2014-11-14 LAB — INSULIN, RANDOM: Insulin: 4 u[IU]/mL (ref 2.0–19.6)

## 2014-11-14 LAB — VITAMIN D 25 HYDROXY (VIT D DEFICIENCY, FRACTURES): VIT D 25 HYDROXY: 60 ng/mL (ref 30–100)

## 2014-11-17 ENCOUNTER — Other Ambulatory Visit: Payer: Self-pay | Admitting: Internal Medicine

## 2014-11-17 NOTE — Addendum Note (Signed)
Addended by: Mykiah Schmuck A on: 11/17/2014 06:26 PM   Modules accepted: Orders

## 2014-11-29 DIAGNOSIS — M5442 Lumbago with sciatica, left side: Secondary | ICD-10-CM | POA: Diagnosis not present

## 2014-11-29 DIAGNOSIS — M4306 Spondylolysis, lumbar region: Secondary | ICD-10-CM | POA: Diagnosis not present

## 2014-12-03 ENCOUNTER — Other Ambulatory Visit: Payer: Self-pay | Admitting: Orthopaedic Surgery

## 2014-12-03 DIAGNOSIS — Z803 Family history of malignant neoplasm of breast: Secondary | ICD-10-CM | POA: Diagnosis not present

## 2014-12-03 DIAGNOSIS — Z1231 Encounter for screening mammogram for malignant neoplasm of breast: Secondary | ICD-10-CM | POA: Diagnosis not present

## 2014-12-03 DIAGNOSIS — M545 Low back pain: Secondary | ICD-10-CM

## 2014-12-13 ENCOUNTER — Ambulatory Visit
Admission: RE | Admit: 2014-12-13 | Discharge: 2014-12-13 | Disposition: A | Discharge status: Home / Self Care | Payer: Medicare Other | Source: Ambulatory Visit | Attending: Orthopaedic Surgery | Admitting: Orthopaedic Surgery

## 2014-12-13 DIAGNOSIS — M545 Low back pain: Secondary | ICD-10-CM

## 2014-12-13 DIAGNOSIS — M5126 Other intervertebral disc displacement, lumbar region: Secondary | ICD-10-CM | POA: Diagnosis not present

## 2014-12-17 ENCOUNTER — Encounter: Payer: Self-pay | Admitting: Internal Medicine

## 2014-12-25 DIAGNOSIS — M5442 Lumbago with sciatica, left side: Secondary | ICD-10-CM | POA: Diagnosis not present

## 2014-12-25 DIAGNOSIS — M4306 Spondylolysis, lumbar region: Secondary | ICD-10-CM | POA: Diagnosis not present

## 2014-12-28 DIAGNOSIS — M5416 Radiculopathy, lumbar region: Secondary | ICD-10-CM | POA: Diagnosis not present

## 2014-12-28 DIAGNOSIS — R262 Difficulty in walking, not elsewhere classified: Secondary | ICD-10-CM | POA: Diagnosis not present

## 2015-01-04 DIAGNOSIS — M5416 Radiculopathy, lumbar region: Secondary | ICD-10-CM | POA: Diagnosis not present

## 2015-01-04 DIAGNOSIS — R262 Difficulty in walking, not elsewhere classified: Secondary | ICD-10-CM | POA: Diagnosis not present

## 2015-01-18 ENCOUNTER — Encounter: Payer: Self-pay | Admitting: Gastroenterology

## 2015-01-24 DIAGNOSIS — M5416 Radiculopathy, lumbar region: Secondary | ICD-10-CM | POA: Diagnosis not present

## 2015-01-24 DIAGNOSIS — R262 Difficulty in walking, not elsewhere classified: Secondary | ICD-10-CM | POA: Diagnosis not present

## 2015-03-19 ENCOUNTER — Ambulatory Visit: Payer: Self-pay | Admitting: Internal Medicine

## 2015-04-15 ENCOUNTER — Other Ambulatory Visit: Payer: Self-pay | Admitting: Internal Medicine

## 2015-04-15 DIAGNOSIS — M810 Age-related osteoporosis without current pathological fracture: Secondary | ICD-10-CM

## 2015-04-15 MED ORDER — CALCITONIN (SALMON) 200 UNIT/ACT NA SOLN: NASAL | Status: DC

## 2015-06-19 ENCOUNTER — Encounter: Payer: Self-pay | Admitting: Internal Medicine

## 2015-06-19 ENCOUNTER — Ambulatory Visit (INDEPENDENT_AMBULATORY_CARE_PROVIDER_SITE_OTHER): Payer: Medicare Other | Admitting: Internal Medicine

## 2015-06-19 VITALS — BP 122/76 | HR 72 | Temp 97.3°F | Resp 16 | Ht 62.0 in | Wt 114.0 lb

## 2015-06-19 DIAGNOSIS — IMO0001 Reserved for inherently not codable concepts without codable children: Secondary | ICD-10-CM

## 2015-06-19 DIAGNOSIS — E785 Hyperlipidemia, unspecified: Secondary | ICD-10-CM | POA: Insufficient documentation

## 2015-06-19 DIAGNOSIS — R030 Elevated blood-pressure reading, without diagnosis of hypertension: Secondary | ICD-10-CM | POA: Insufficient documentation

## 2015-06-19 DIAGNOSIS — R7309 Other abnormal glucose: Secondary | ICD-10-CM | POA: Insufficient documentation

## 2015-06-19 DIAGNOSIS — Z79899 Other long term (current) drug therapy: Secondary | ICD-10-CM | POA: Insufficient documentation

## 2015-06-19 DIAGNOSIS — E559 Vitamin D deficiency, unspecified: Secondary | ICD-10-CM

## 2015-06-19 LAB — LIPID PANEL
Cholesterol: 223 mg/dL — ABNORMAL HIGH (ref 125–200)
HDL: 102 mg/dL (ref >=46)
LDL CALC: 93 mg/dL (ref <130)
Total CHOL/HDL Ratio: 2.2 Ratio (ref <=5.0)
Triglycerides: 140 mg/dL (ref <150)
VLDL: 28 mg/dL (ref <30)

## 2015-06-19 LAB — BASIC METABOLIC PANEL WITH GFR
BUN: 17 mg/dL (ref 7–25)
CO2: 29 mmol/L (ref 20–31)
Calcium: 10.3 mg/dL (ref 8.6–10.4)
Chloride: 101 mmol/L (ref 98–110)
Creat: 0.87 mg/dL (ref 0.50–0.99)
GFR, EST AFRICAN AMERICAN: 80 mL/min (ref >=60)
GFR, Est Non African American: 70 mL/min (ref >=60)
GLUCOSE: 74 mg/dL (ref 65–99)
POTASSIUM: 4.8 mmol/L (ref 3.5–5.3)
Sodium: 139 mmol/L (ref 135–146)

## 2015-06-19 LAB — CBC WITH DIFFERENTIAL/PLATELET
BASOS ABS: 0 {cells}/uL (ref 0–200)
BASOS PCT: 0 %
EOS ABS: 112 {cells}/uL (ref 15–500)
Eosinophils Relative: 2 %
HEMATOCRIT: 45.2 % — AB (ref 35.0–45.0)
Hemoglobin: 15 g/dL (ref 11.7–15.5)
LYMPHS PCT: 34 %
Lymphs Abs: 1904 cells/uL (ref 850–3900)
MCH: 30.1 pg (ref 27.0–33.0)
MCHC: 33.2 g/dL (ref 32.0–36.0)
MCV: 90.8 fL (ref 80.0–100.0)
MONO ABS: 448 {cells}/uL (ref 200–950)
MPV: 8.6 fL (ref 7.5–12.5)
Monocytes Relative: 8 %
NEUTROS PCT: 56 %
Neutro Abs: 3136 cells/uL (ref 1500–7800)
Platelets: 270 10*3/uL (ref 140–400)
RBC: 4.98 MIL/uL (ref 3.80–5.10)
RDW: 13.8 % (ref 11.0–15.0)
WBC: 5.6 10*3/uL (ref 3.8–10.8)

## 2015-06-19 LAB — HEMOGLOBIN A1C
HEMOGLOBIN A1C: 5.1 % (ref <5.7)
MEAN PLASMA GLUCOSE: 100 mg/dL

## 2015-06-19 LAB — HEPATIC FUNCTION PANEL
ALBUMIN: 4.7 g/dL (ref 3.6–5.1)
ALK PHOS: 52 U/L (ref 33–130)
ALT: 14 U/L (ref 6–29)
AST: 19 U/L (ref 10–35)
Bilirubin, Direct: 0.1 mg/dL (ref <=0.2)
Indirect Bilirubin: 0.5 mg/dL (ref 0.2–1.2)
TOTAL PROTEIN: 7.4 g/dL (ref 6.1–8.1)
Total Bilirubin: 0.6 mg/dL (ref 0.2–1.2)

## 2015-06-19 LAB — MAGNESIUM: Magnesium: 2.1 mg/dL (ref 1.5–2.5)

## 2015-06-19 LAB — TSH: TSH: 3.06 m[IU]/L

## 2015-06-19 NOTE — Progress Notes (Signed)
Patient ID: Sheila Gray, female   DOB: [birth year], 65 y.o.   MRN: [id number]   This very nice 65 y.o. MWF presents for  follow up and proactive monitoring for elevated BP, Hyperlipidemia, Pre-Diabetes and Vitamin D Deficiency. Patient also has Osteoporosis from Aug 2016 with T-Score -2.6  at the R hip & in the osteopenia range in theL hip and spine and as she had been on Fosamax in the past, she was was started on Miacalcin. Patient is resuming exercising by walking.     Patient is followed expectantly for labile HTN & BP has been controlled at home with diet , exercise and we. Today's BP: 122/76 mmHg. Patient has had no complaints of any cardiac type chest pain, palpitations, dyspnea/orthopnea/PND, dizziness, claudication, or dependent edema.   Hyperlipidemia is controlled with diet.  Last Lipids were at goal with Cholesterol 215*; HDL 111; LDL 81; Triglycerides 115 in Aug 2016.    Also, the patient is screened expectantly for PreDiabetes and has had no symptoms of reactive hypoglycemia, diabetic polys, paresthesias or visual blurring.  Last A1c was  5.0% on 11/13/2014.   Further, the patient also has history of Vitamin D Deficiency and supplements vitamin D without any suspected side-effects. Last vitamin D was 60 on 11/13/2014.  Medication Sig  . calcitonin, salmon, (MIACALCIN) 200 UNIT/ACT nasal spray 1 nasal spray dai;ly - alternating side every other day  . Cholecalciferol (VITAMIN D PO) Take 4,000 Units by mouth daily.  . fexofenadine (ALLEGRA) 180 MG tablet Take 180 mg by mouth daily.  . meloxicam 15 MG tablet Take 1/2 to 1 tablet daily with food for pain & inflammation  . Multiple Vitamin Take 1 tablet by mouth daily.  Marland Kitchen PROBIOTIC DAILY Take by mouth.  Marland Kitchen CALCIUM + D Take by mouth daily.   No Known Allergies  PMHx:   Past Medical History  Diagnosis Date  . Allergy   . Osteoporosis   . Vitamin D deficiency    Immunization History  Administered Date(s) Administered  . PPD Test  11/09/2013  . Pneumococcal Conjugate-13 03/29/2014  . Td 05/14/2005  . Zoster 11/28/2009   No past surgical history on file. FHx:    Reviewed / unchanged  SHx:    Reviewed / unchanged  Systems Review:  Constitutional: Denies fever, chills, wt changes, headaches, insomnia, fatigue, night sweats, change in appetite. Eyes: Denies redness, blurred vision, diplopia, discharge, itchy, watery eyes.  ENT: Denies discharge, congestion, post nasal drip, epistaxis, sore throat, earache, hearing loss, dental pain, tinnitus, vertigo, sinus pain, snoring.  CV: Denies chest pain, palpitations, irregular heartbeat, syncope, dyspnea, diaphoresis, orthopnea, PND, claudication or edema. Respiratory: denies cough, dyspnea, DOE, pleurisy, hoarseness, laryngitis, wheezing.  Gastrointestinal: Denies dysphagia, odynophagia, heartburn, reflux, water brash, abdominal pain or cramps, nausea, vomiting, bloating, diarrhea, constipation, hematemesis, melena, hematochezia  or hemorrhoids. Genitourinary: Denies dysuria, frequency, urgency, nocturia, hesitancy, discharge, hematuria or flank pain. Musculoskeletal: Denies arthralgias, myalgias, stiffness, jt. swelling, pain, limping or strain/sprain.  Skin: Denies pruritus, rash, hives, warts, acne, eczema or change in skin lesion(s). Neuro: No weakness, tremor, incoordination, spasms, paresthesia or pain. Psychiatric: Denies confusion, memory loss or sensory loss. Endo: Denies change in weight, skin or hair change.  Heme/Lymph: No excessive bleeding, bruising or enlarged lymph nodes.  Physical Exam  BP 122/76 mmHg  Pulse 72  Temp(Src) 97.3 F (36.3 C)  Resp 16  Ht 5\' 2"  (1.575 m)  Wt 114 lb (51.71 kg)  BMI 20.85 kg/m2  Appears well nourished and  in no distress. Eyes: PERRLA, EOMs, conjunctiva no swelling or erythema. Sinuses: No frontal/maxillary tenderness ENT/Mouth: EAC's clear, TM's nl w/o erythema, bulging. Nares clear w/o erythema, swelling, exudates.  Oropharynx clear without erythema or exudates. Oral hygiene is good. Tongue normal, non obstructing. Hearing intact.  Neck: Supple. Thyroid nl. Car 2+/2+ without bruits, nodes or JVD. Chest: Respirations nl with BS clear & equal w/o rales, rhonchi, wheezing or stridor.  Cor: Heart sounds normal w/ regular rate and rhythm without sig. murmurs, gallops, clicks, or rubs. Peripheral pulses normal and equal  without edema.  Abdomen: Soft & bowel sounds normal. Non-tender w/o guarding, rebound, hernias, masses, or organomegaly.  Lymphatics: Unremarkable.  Musculoskeletal: Full ROM all peripheral extremities, joint stability, 5/5 strength, and normal gait.  Skin: Warm, dry without exposed rashes, lesions or ecchymosis apparent.  Neuro: Cranial nerves intact, reflexes equal bilaterally. Sensory-motor testing grossly intact. Tendon reflexes grossly intact.  Pysch: Alert & oriented x 3.  Insight and judgement nl & appropriate. No ideations.  Assessment and Plan:  1. Elevated BP  - TSH  2. Hyperlipemia  - Lipid panel - TSH  3. Other abnormal glucose  - Hemoglobin A1c - Insulin, random  4. Vitamin D deficiency  - VITAMIN D 25 Hydroxy   5. Medication management  - CBC with Differential/Platelet - BASIC METABOLIC PANEL WITH GFR - Hepatic function panel - Magnesium   Recommended regular exercise, BP monitoring, weight control, and discussed med and SE's. Recommended labs to assess and monitor clinical status. Further disposition pending results of labs. Over 30 minutes of exam, counseling, chart review was performed

## 2015-06-19 NOTE — Patient Instructions (Signed)

## 2015-06-20 LAB — INSULIN, RANDOM: INSULIN: 7.3 u[IU]/mL (ref 2.0–19.6)

## 2015-06-20 LAB — VITAMIN D 25 HYDROXY (VIT D DEFICIENCY, FRACTURES): Vit D, 25-Hydroxy: 71 ng/mL (ref 30–100)

## 2015-08-15 DIAGNOSIS — Z124 Encounter for screening for malignant neoplasm of cervix: Secondary | ICD-10-CM | POA: Diagnosis not present

## 2015-09-22 ENCOUNTER — Encounter: Payer: Self-pay | Admitting: *Deleted

## 2015-12-04 ENCOUNTER — Encounter: Payer: Self-pay | Admitting: Internal Medicine

## 2015-12-11 DIAGNOSIS — Z803 Family history of malignant neoplasm of breast: Secondary | ICD-10-CM | POA: Diagnosis not present

## 2015-12-11 DIAGNOSIS — Z1231 Encounter for screening mammogram for malignant neoplasm of breast: Secondary | ICD-10-CM | POA: Diagnosis not present

## 2015-12-12 ENCOUNTER — Encounter: Payer: Self-pay | Admitting: Internal Medicine

## 2015-12-12 ENCOUNTER — Ambulatory Visit (INDEPENDENT_AMBULATORY_CARE_PROVIDER_SITE_OTHER): Payer: Medicare Other | Admitting: Internal Medicine

## 2015-12-12 VITALS — BP 112/62 | HR 76 | Temp 97.6°F | Resp 16 | Ht 61.5 in | Wt 113.6 lb

## 2015-12-12 DIAGNOSIS — IMO0001 Reserved for inherently not codable concepts without codable children: Secondary | ICD-10-CM

## 2015-12-12 DIAGNOSIS — Z23 Encounter for immunization: Secondary | ICD-10-CM

## 2015-12-12 DIAGNOSIS — E785 Hyperlipidemia, unspecified: Secondary | ICD-10-CM

## 2015-12-12 DIAGNOSIS — Z79899 Other long term (current) drug therapy: Secondary | ICD-10-CM | POA: Diagnosis not present

## 2015-12-12 DIAGNOSIS — E538 Deficiency of other specified B group vitamins: Secondary | ICD-10-CM | POA: Diagnosis not present

## 2015-12-12 DIAGNOSIS — Z136 Encounter for screening for cardiovascular disorders: Secondary | ICD-10-CM

## 2015-12-12 DIAGNOSIS — D509 Iron deficiency anemia, unspecified: Secondary | ICD-10-CM

## 2015-12-12 DIAGNOSIS — R6889 Other general symptoms and signs: Secondary | ICD-10-CM | POA: Diagnosis not present

## 2015-12-12 DIAGNOSIS — E559 Vitamin D deficiency, unspecified: Secondary | ICD-10-CM | POA: Diagnosis not present

## 2015-12-12 DIAGNOSIS — Z0000 Encounter for general adult medical examination without abnormal findings: Secondary | ICD-10-CM | POA: Diagnosis not present

## 2015-12-12 DIAGNOSIS — R7309 Other abnormal glucose: Secondary | ICD-10-CM

## 2015-12-12 DIAGNOSIS — Z0001 Encounter for general adult medical examination with abnormal findings: Secondary | ICD-10-CM

## 2015-12-12 DIAGNOSIS — R030 Elevated blood-pressure reading, without diagnosis of hypertension: Secondary | ICD-10-CM

## 2015-12-12 DIAGNOSIS — Z1212 Encounter for screening for malignant neoplasm of rectum: Secondary | ICD-10-CM

## 2015-12-12 LAB — CBC WITH DIFFERENTIAL/PLATELET
BASOS PCT: 1 %
Basophils Absolute: 63 cells/uL (ref 0–200)
EOS ABS: 126 {cells}/uL (ref 15–500)
Eosinophils Relative: 2 %
HEMATOCRIT: 43.9 % (ref 35.0–45.0)
HEMOGLOBIN: 14.8 g/dL (ref 11.7–15.5)
LYMPHS ABS: 2268 {cells}/uL (ref 850–3900)
LYMPHS PCT: 36 %
MCH: 30.8 pg (ref 27.0–33.0)
MCHC: 33.7 g/dL (ref 32.0–36.0)
MCV: 91.3 fL (ref 80.0–100.0)
MONO ABS: 567 {cells}/uL (ref 200–950)
MPV: 8.4 fL (ref 7.5–12.5)
Monocytes Relative: 9 %
Neutro Abs: 3276 cells/uL (ref 1500–7800)
Neutrophils Relative %: 52 %
Platelets: 230 10*3/uL (ref 140–400)
RBC: 4.81 MIL/uL (ref 3.80–5.10)
RDW: 13.7 % (ref 11.0–15.0)
WBC: 6.3 10*3/uL (ref 3.8–10.8)

## 2015-12-12 LAB — BASIC METABOLIC PANEL WITH GFR
BUN: 13 mg/dL (ref 7–25)
CO2: 26 mmol/L (ref 20–31)
Calcium: 9.5 mg/dL (ref 8.6–10.4)
Chloride: 103 mmol/L (ref 98–110)
Creat: 0.85 mg/dL (ref 0.50–0.99)
GFR, EST AFRICAN AMERICAN: 83 mL/min (ref >=60)
GFR, EST NON AFRICAN AMERICAN: 72 mL/min (ref >=60)
GLUCOSE: 74 mg/dL (ref 65–99)
POTASSIUM: 4.1 mmol/L (ref 3.5–5.3)
Sodium: 140 mmol/L (ref 135–146)

## 2015-12-12 LAB — HEPATIC FUNCTION PANEL
ALBUMIN: 4.3 g/dL (ref 3.6–5.1)
ALK PHOS: 45 U/L (ref 33–130)
ALT: 17 U/L (ref 6–29)
AST: 20 U/L (ref 10–35)
BILIRUBIN INDIRECT: 0.3 mg/dL (ref 0.2–1.2)
Bilirubin, Direct: 0.1 mg/dL (ref <=0.2)
TOTAL PROTEIN: 6.9 g/dL (ref 6.1–8.1)
Total Bilirubin: 0.4 mg/dL (ref 0.2–1.2)

## 2015-12-12 LAB — LIPID PANEL
Cholesterol: 226 mg/dL — ABNORMAL HIGH (ref 125–200)
HDL: 99 mg/dL (ref >=46)
LDL CALC: 108 mg/dL (ref <130)
TRIGLYCERIDES: 95 mg/dL (ref <150)
Total CHOL/HDL Ratio: 2.3 Ratio (ref <=5.0)
VLDL: 19 mg/dL (ref <30)

## 2015-12-12 LAB — IRON AND TIBC
%SAT: 29 % (ref 11–50)
IRON: 94 ug/dL (ref 45–160)
TIBC: 324 ug/dL (ref 250–450)
UIBC: 230 ug/dL (ref 125–400)

## 2015-12-12 LAB — VITAMIN B12: Vitamin B-12: 446 pg/mL (ref 200–1100)

## 2015-12-12 LAB — TSH: TSH: 2.85 m[IU]/L

## 2015-12-12 LAB — MAGNESIUM: Magnesium: 2.1 mg/dL (ref 1.5–2.5)

## 2015-12-12 NOTE — Progress Notes (Signed)
[facility] Unk Pinto, M.D.    [name]. [name], P.A.-C      Starlyn Skeans, P.A.-C  [facility]                [address] [place], [place] [id number] Telephone [phone] Telefax [phone]  Annual Screening/Preventative Visit & Comprehensive Evaluation &  Examination     This very nice 65 y.o. MWF presents for a Screening/Preventative Visit & comprehensive evaluation and management of multiple medical co-morbidities.  Patient has been followed expectantly screening for elevated BP, Prediabetes, Hyperlipidemia and Vitamin D Deficiency.      Patient is screened proactively for elevated BP and BP has been controlled at home and patient denies any cardiac symptoms as chest pain, palpitations, shortness of breath, dizziness or ankle swelling. Today's BP is 112/62.      Patient's hyperlipidemia is controlled with diet and is at goal with last lipids with a very elevated HDL 102: Lab Results  Component Value Date   CHOL 223 (H) 06/19/2015   HDL 102 06/19/2015   LDLCALC 93 06/19/2015   TRIG 140 06/19/2015   CHOLHDL 2.2 06/19/2015      Patient is screened proactively for prediabetes and patient denies reactive hypoglycemic symptoms, visual blurring, diabetic polys, or paresthesias. Last A1c was at goal: Lab Results  Component Value Date   HGBA1C 5.1 06/19/2015      Finally, patient has history of Vitamin D Deficiency and last Vitamin D was at goal: Lab Results  Component Value Date   VD25OH 71 06/19/2015   Current Outpatient Prescriptions on File Prior to Visit  Medication Sig  . Calcitonin / MIACALCIN nasal spr 1 nasal spray dai;ly - alternating side every other day  . CALCIUM CITRATE + D3  Take 1 tablet by mouth daily.  Marland Kitchen VITAMIN D Take 4,000 Units by mouth daily.  . fexofenadine  180 MG tablet Take 180 mg by mouth daily.  . Multiple Vitamin Take 1 tablet by mouth daily.  . Probiotic  Product  Take by mouth.   No Known Allergies   Past Medical History:  Diagnosis Date  . Allergy   . Osteoporosis   . Vitamin D deficiency    Health Maintenance  Topic Date Due  . PNA vac Low Risk Adult (2 of 2 - PPSV23) 03/30/2015  . TETANUS/TDAP  05/15/2015  . INFLUENZA VACCINE  10/15/2015  . MAMMOGRAM  12/02/2016  . COLONOSCOPY  11/27/2019  . DEXA SCAN  Completed  . ZOSTAVAX  Completed  . Hepatitis C Screening  Completed   Immunization History  Administered Date(s) Administered  . Influenza-Unspecified 12/09/2015  . PPD Test 11/09/2013  . Pneumococcal Conjugate-13 03/29/2014  . Td 05/14/2005  . Zoster 11/28/2009   History reviewed. No pertinent surgical history. Family History  Problem Relation Age of Onset  . Hypertension Mother   . Kidney disease Father   . Heart disease Father    Social History  Substance Use Topics  . Smoking status: Never Smoker  . Smokeless tobacco: Never Used  . Alcohol use 1.5 oz/week    3 drink(s) per week    ROS Constitutional: Denies fever, chills, weight loss/gain, headaches, insomnia,  night sweats, and change in appetite. Does c/o fatigue. Eyes: Denies redness, blurred vision, diplopia, discharge, itchy, watery eyes.  ENT: Denies discharge, congestion, post nasal drip, epistaxis, sore throat, earache, hearing  loss, dental pain, Tinnitus, Vertigo, Sinus pain, snoring.  Cardio: Denies chest pain, palpitations, irregular heartbeat, syncope, dyspnea, diaphoresis, orthopnea, PND, claudication, edema Respiratory: denies cough, dyspnea, DOE, pleurisy, hoarseness, laryngitis, wheezing.  Gastrointestinal: Denies dysphagia, heartburn, reflux, water brash, pain, cramps, nausea, vomiting, bloating, diarrhea, constipation, hematemesis, melena, hematochezia, jaundice, hemorrhoids Genitourinary: Denies dysuria, frequency, urgency, nocturia, hesitancy, discharge, hematuria, flank pain Breast: Breast lumps, nipple discharge, bleeding.   Musculoskeletal: Denies arthralgia, myalgia, stiffness, Jt. Swelling, pain, limp, and strain/sprain. Denies falls. Skin: Denies puritis, rash, hives, warts, acne, eczema, changing in skin lesion Neuro: No weakness, tremor, incoordination, spasms, paresthesia, pain Psychiatric: Denies confusion, memory loss, sensory loss. Denies Depression. Endocrine: Denies change in weight, skin, hair change, nocturia, and paresthesia, diabetic polys, visual blurring, hyper / hypo glycemic episodes.  Heme/Lymph: No excessive bleeding, bruising, enlarged lymph nodes.  Physical Exam  BP 112/62   Pulse 76   Temp 97.6 F (36.4 C)   Resp 16   Ht 5' 1.5" (1.562 m)   Wt 113 lb 9.6 oz (51.5 kg)   BMI 21.12 kg/m   General Appearance: Well nourished and in no apparent distress.  Eyes: PERRLA, EOMs, conjunctiva no swelling or erythema, normal fundi and vessels. Sinuses: No frontal/maxillary tenderness ENT/Mouth: EACs patent / TMs  nl. Nares clear without erythema, swelling, mucoid exudates. Oral hygiene is good. No erythema, swelling, or exudate. Tongue normal, non-obstructing. Tonsils not swollen or erythematous. Hearing normal.  Neck: Supple, thyroid normal. No bruits, nodes or JVD. Respiratory: Respiratory effort normal.  BS equal and clear bilateral without rales, rhonci, wheezing or stridor. Cardio: Heart sounds are normal with regular rate and rhythm and no murmurs, rubs or gallops. Peripheral pulses are normal and equal bilaterally without edema. No aortic or femoral bruits. Chest: symmetric with normal excursions and percussion. Breasts: Deferred to GYN. Abdomen: Flat, soft with bowel sounds active. Nontender, no guarding, rebound, hernias, masses, or organomegaly.  Lymphatics: Non tender without lymphadenopathy.  Musculoskeletal: Full ROM all peripheral extremities, joint stability, 5/5 strength, and normal gait. Skin: Warm and dry without rashes, lesions, cyanosis, clubbing or  ecchymosis.  Neuro:  Cranial nerves intact, reflexes equal bilaterally. Normal muscle tone, no cerebellar symptoms. Sensation intact.  Pysch: Alert and oriented X 3, normal affect, Insight and Judgment appropriate.   Assessment and Plan  1. Annual Preventative Screening Examination  - Microalbumin / creatinine urine ratio - EKG 12-Lead - POC Hemoccult Bld/Stl  - Urinalysis, Routine w reflex microscopic  - Vitamin B12 - Iron and TIBC - CBC with Differential/Platelet - BASIC METABOLIC PANEL WITH GFR - Hepatic function panel - Magnesium - Lipid panel - TSH - Hemoglobin A1c - Insulin, random - VITAMIN D 25 Hydroxy  - aspirin EC 81 MG tablet; Take 81 mg by mouth daily.  2. Elevated BP  - Microalbumin / creatinine urine ratio - EKG 12-Lead - TSH  3. Hyperlipemia  - EKG 12-Lead - Lipid panel - TSH  4. Other abnormal glucose  - EKG 12-Lead - Hemoglobin A1c - Insulin, random  5. Vitamin D deficiency  - VITAMIN D 25 Hydroxy   6. Vitamin B 12 deficiency  - Vitamin B12  7. Anemia, iron deficiency  - Iron and TIBC  8. Screening for rectal cancer  - POC Hemoccult Bld/Stl   9. Screening for ischemic heart disease  - EKG 12-Lead  10. Medication management  - Urinalysis, Routine w reflex microscopic  - CBC with Differential/Platelet - BASIC METABOLIC PANEL WITH GFR - Hepatic function panel - Magnesium  Continue prudent diet as discussed, weight control, BP monitoring, regular exercise, and medications. Discussed med's effects and SE's. Screening labs and tests as requested with regular follow-up as recommended. Over 40 minutes of exam, counseling, chart review and high complex critical decision making was performed.

## 2015-12-12 NOTE — Patient Instructions (Signed)

## 2015-12-12 NOTE — Addendum Note (Signed)
Addended by: Melbourne Abts C on: 12/12/2015 11:13 AM   Modules accepted: Orders

## 2015-12-13 LAB — MICROALBUMIN / CREATININE URINE RATIO
CREATININE, URINE: 90 mg/dL (ref 20–320)
MICROALB UR: 0.3 mg/dL
MICROALB/CREAT RATIO: 3 ug/mg{creat} (ref <30)

## 2015-12-13 LAB — URINALYSIS, ROUTINE W REFLEX MICROSCOPIC
Bilirubin Urine: NEGATIVE
GLUCOSE, UA: NEGATIVE
HGB URINE DIPSTICK: NEGATIVE
KETONES UR: NEGATIVE
Nitrite: NEGATIVE
Protein, ur: NEGATIVE
Specific Gravity, Urine: 1.016 (ref 1.001–1.035)
pH: 7.5 (ref 5.0–8.0)

## 2015-12-13 LAB — HEMOGLOBIN A1C
HEMOGLOBIN A1C: 5 % (ref <5.7)
MEAN PLASMA GLUCOSE: 97 mg/dL

## 2015-12-13 LAB — URINALYSIS, MICROSCOPIC ONLY
Bacteria, UA: NONE SEEN [HPF]
CASTS: NONE SEEN [LPF]
Crystals: NONE SEEN [HPF]
RBC / HPF: NONE SEEN RBC/HPF (ref <=2)
YEAST: NONE SEEN [HPF]

## 2015-12-13 LAB — VITAMIN D 25 HYDROXY (VIT D DEFICIENCY, FRACTURES): Vit D, 25-Hydroxy: 54 ng/mL (ref 30–100)

## 2015-12-13 LAB — INSULIN, RANDOM: INSULIN: 9.2 u[IU]/mL (ref 2.0–19.6)

## 2016-01-09 ENCOUNTER — Ambulatory Visit (INDEPENDENT_AMBULATORY_CARE_PROVIDER_SITE_OTHER): Payer: Medicare Other | Admitting: *Deleted

## 2016-01-09 DIAGNOSIS — Z23 Encounter for immunization: Secondary | ICD-10-CM

## 2016-01-19 ENCOUNTER — Encounter: Payer: Self-pay | Admitting: *Deleted

## 2016-07-21 NOTE — Progress Notes (Signed)
MEDICARE ANNUAL WELLNESS VISIT AND CPE  Assessment:    Seasonal allergic rhinitis due to pollen Continue OTC allergy pills -     predniSONE (DELTASONE) 20 MG tablet; 2 tablets daily for 3 days, 1 tablet daily for 4 days.  Osteoporosis, unspecified osteoporosis type, unspecified pathological fracture presence -     DG Bone Density; Future -     calcitonin, salmon, (MIACALCIN) 200 UNIT/ACT nasal spray; 1 nasal spray dai;ly - alternating side every other day  Vitamin D deficiency Continue supplement  Elevated BP without diagnosis of hypertension - continue medications, DASH diet, exercise and monitor at home. Call if greater than 130/80.  -     CBC with Differential/Platelet -     BASIC METABOLIC PANEL WITH GFR -     Hepatic function panel -     TSH  Hyperlipidemia, unspecified hyperlipidemia type -continue medications, check lipids, decrease fatty foods, increase activity.  -     Lipid panel  Medication management -     Magnesium  Encounter for Medicare annual wellness exam  Cough Will hold the zpak and take if she is not getting better, increase fluids, rest, cont allergy pill -     predniSONE (DELTASONE) 20 MG tablet; 2 tablets daily for 3 days, 1 tablet daily for 4 days. -     azithromycin (ZITHROMAX) 250 MG tablet; Take 2 tablets (500 mg) on  Day 1,  followed by 1 tablet (250 mg) once daily on Days 2 through 5.    Over 40 minutes of exam, counseling, chart review and critical decision making was performed Future Appointments Date Time Provider [place]  01/11/2017 9:00 AM Unk Pinto, MD [facility] None     Plan:   During the course of the visit the patient was educated and counseled about appropriate screening and preventive services including:    Pneumococcal vaccine   Prevnar 13  Influenza vaccine  Td vaccine  Screening electrocardiogram  Bone densitometry screening  Colorectal cancer screening  Diabetes screening  Glaucoma  screening  Nutrition counseling   Advanced directives: requested   Subjective:  Sheila Gray is a 66 y.o. female who presents for Medicare Annual Wellness Visit and complete physical.     Her blood pressure has been controlled at home, today their BP is BP: 122/78 She does workout. She denies chest pain, shortness of breath, dizziness.  She is on cholesterol medication and denies myalgias. Her cholesterol is at goal. The cholesterol last visit was:   Lab Results  Component Value Date   CHOL 226 (H) 12/12/2015   HDL 99 12/12/2015   LDLCALC 108 12/12/2015   TRIG 95 12/12/2015   CHOLHDL 2.3 12/12/2015   Last A1C Lab Results  Component Value Date   HGBA1C 5.0 12/12/2015   Last GFR: Lab Results  Component Value Date   GFRNONAA 72 12/12/2015   Patient is on Vitamin D supplement.   Lab Results  Component Value Date   VD25OH 54 12/12/2015      Medication Review: Current Outpatient Prescriptions on File Prior to Visit  Medication Sig Dispense Refill  . aspirin EC 81 MG tablet Take 81 mg by mouth daily.    . Calcium Citrate-Vitamin D (CALCIUM CITRATE + D3 PO) Take 1 tablet by mouth daily.    . Cholecalciferol (VITAMIN D PO) Take 4,000 Units by mouth daily.    . fexofenadine (ALLEGRA) 180 MG tablet Take 180 mg by mouth daily.    . Multiple Vitamin (MULTIVITAMIN) tablet  Take 1 tablet by mouth daily.     No current facility-administered medications on file prior to visit.     No Known Allergies  Current Problems (verified) Patient Active Problem List   Diagnosis Date Noted  . Elevated BP without diagnosis of hypertension 06/19/2015  . Hyperlipemia 06/19/2015  . Medication management 06/19/2015  . Allergic rhinitis 04/10/2013  . Osteoporosis 04/10/2013  . Vitamin D deficiency 04/10/2013    Screening Tests Immunization History  Administered Date(s) Administered  . Influenza-Unspecified 12/09/2015  . PPD Test 11/09/2013  . Pneumococcal Conjugate-13 03/29/2014  .  Pneumococcal Polysaccharide-23 01/09/2016  . Td 05/14/2005, 12/12/2015  . Zoster 11/28/2009    Preventative care: Last colonoscopy: 2011 Last mammogram: 11/2015 Last pap smear/pelvic exam: never abnormal pap  DEXA:2016 has been on calcitonin  Prior vaccinations: TD or Tdap: 2017 Influenza: 2017 Pneumococcal: 2017 Prevnar13: 2016 Shingles/Zostavax: 2011  Names of Other Physician/Practitioners you currently use: 1. [facility] here for primary care 2. Man[place], eye doctor, last visit 2018 3. Shanon Brow, dentist, last visit appt coming up Patient Care Team: Unk Pinto, MD as PCP - General (Internal Medicine) Inda Castle, MD as Consulting Physician (Gastroenterology) Love, Alyson Locket, MD as Consulting Physician (Neurology) [place] Ander Slade, MD (Rheumatology) Lavonna Monarch, MD as Consulting Physician (Dermatology) Garald Balding, MD as Consulting Physician (Orthopedic Surgery) Gus Height, MD as Consulting Physician (Obstetrics and Gynecology)  SURGICAL HISTORY She  has no past surgical history on file. FAMILY HISTORY Her family history includes Heart disease in her father; Hypertension in her mother; Kidney disease in her father. SOCIAL HISTORY She  reports that she has never smoked. She has never used smokeless tobacco. She reports that she drinks about 1.5 oz of alcohol per week . She reports that she does not use drugs.   MEDICARE WELLNESS OBJECTIVES: Physical activity: Current Exercise Habits: Home exercise routine, Type of exercise: walking, Time (Minutes): 30, Frequency (Times/Week): 5, Weekly Exercise (Minutes/Week): 150 Cardiac risk factors: Cardiac Risk Factors include: advanced age (>74men, >3 women);dyslipidemia;hypertension Depression/mood screen:   Depression screen [facility] 2/9 07/22/2016  Decreased Interest 0  Down, Depressed, Hopeless 0  PHQ - 2 Score 0    ADLs:  In your present state of health, do you have any  difficulty performing the following activities: 07/22/2016 12/12/2015  Hearing? N -  Vision? N N  Difficulty concentrating or making decisions? N N  Walking or climbing stairs? N N  Dressing or bathing? N N  Doing errands, shopping? N N  Some recent data might be hidden     Cognitive Testing  Alert? Yes  Normal Appearance?Yes  Oriented to person? Yes  Place? Yes   Time? Yes  Recall of three objects?  Yes  Can perform simple calculations? Yes  Displays appropriate judgment?Yes  Can read the correct time from a watch face?Yes  EOL planning: Does Patient Have a Medical Advance Directive?: Yes Type of Advance Directive: Healthcare Power of Attorney, Living will Copy of [place] in Chart?: No - copy requested  Review of Systems  Constitutional: Negative.   HENT: Negative.   Eyes: Negative.   Respiratory: Positive for cough. Negative for hemoptysis, sputum production, shortness of breath and wheezing.   Cardiovascular: Negative.   Gastrointestinal: Negative.   Genitourinary: Negative.   Musculoskeletal: Negative.   Skin: Negative.   Neurological: Negative.   Endo/Heme/Allergies: Negative.   Psychiatric/Behavioral: Negative.      Objective:     Today's Vitals  07/22/16 1448  BP: 122/78  Pulse: 73  Resp: 16  Temp: 97.5 F (36.4 C)  SpO2: 98%  Weight: 112 lb (50.8 kg)  Height: 5' 1.5" (1.562 m)  PainSc: 0-No pain   Body mass index is 20.82 kg/m.  General appearance: alert, no distress, WD/WN, female HEENT: normocephalic, sclerae anicteric, TMs pearly, nares patent, no discharge or erythema, pharynx normal Oral cavity: MMM, no lesions Neck: supple, no lymphadenopathy, no thyromegaly, no masses Heart: RRR, normal S1, S2, no murmurs Lungs: CTA bilaterally, no wheezes, rhonchi, or rales Abdomen: +bs, soft, non tender, non distended, no masses, no hepatomegaly, no splenomegaly Musculoskeletal: nontender, no swelling, no obvious  deformity Extremities: no edema, no cyanosis, no clubbing Pulses: 2+ symmetric, upper and lower extremities, normal cap refill Neurological: alert, oriented x 3, CN2-12 intact, strength normal upper extremities and lower extremities, sensation normal throughout, DTRs 2+ throughout, no cerebellar signs, gait normal Psychiatric: normal affect, behavior normal, pleasant   Medicare Attestation I have personally reviewed: The patient's medical and social history Their use of alcohol, tobacco or illicit drugs Their current medications and supplements The patient's functional ability including ADLs,fall risks, home safety risks, cognitive, and hearing and visual impairment Diet and physical activities Evidence for depression or mood disorders  The patient's weight, height, BMI, and visual acuity have been recorded in the chart.  I have made referrals, counseling, and provided education to the patient based on review of the above and I have provided the patient with a written personalized care plan for preventive services.     Vicie Mutters, PA-C   07/22/2016

## 2016-07-22 ENCOUNTER — Ambulatory Visit (INDEPENDENT_AMBULATORY_CARE_PROVIDER_SITE_OTHER): Payer: Medicare Other | Admitting: Physician Assistant

## 2016-07-22 ENCOUNTER — Encounter: Payer: Self-pay | Admitting: Physician Assistant

## 2016-07-22 VITALS — BP 122/78 | HR 73 | Temp 97.5°F | Resp 16 | Ht 61.5 in | Wt 112.0 lb

## 2016-07-22 DIAGNOSIS — M810 Age-related osteoporosis without current pathological fracture: Secondary | ICD-10-CM | POA: Diagnosis not present

## 2016-07-22 DIAGNOSIS — R030 Elevated blood-pressure reading, without diagnosis of hypertension: Secondary | ICD-10-CM

## 2016-07-22 DIAGNOSIS — R05 Cough: Secondary | ICD-10-CM | POA: Diagnosis not present

## 2016-07-22 DIAGNOSIS — Z0000 Encounter for general adult medical examination without abnormal findings: Secondary | ICD-10-CM

## 2016-07-22 DIAGNOSIS — R6889 Other general symptoms and signs: Secondary | ICD-10-CM

## 2016-07-22 DIAGNOSIS — E559 Vitamin D deficiency, unspecified: Secondary | ICD-10-CM

## 2016-07-22 DIAGNOSIS — J301 Allergic rhinitis due to pollen: Secondary | ICD-10-CM

## 2016-07-22 DIAGNOSIS — Z0001 Encounter for general adult medical examination with abnormal findings: Secondary | ICD-10-CM | POA: Diagnosis not present

## 2016-07-22 DIAGNOSIS — Z79899 Other long term (current) drug therapy: Secondary | ICD-10-CM

## 2016-07-22 DIAGNOSIS — R059 Cough, unspecified: Secondary | ICD-10-CM

## 2016-07-22 DIAGNOSIS — E785 Hyperlipidemia, unspecified: Secondary | ICD-10-CM | POA: Diagnosis not present

## 2016-07-22 LAB — CBC WITH DIFFERENTIAL/PLATELET
Basophils Absolute: 0 cells/uL (ref 0–200)
Basophils Relative: 0 %
EOS PCT: 1 %
Eosinophils Absolute: 89 cells/uL (ref 15–500)
HCT: 42.3 % (ref 35.0–45.0)
HEMOGLOBIN: 14.1 g/dL (ref 11.7–15.5)
LYMPHS ABS: 2670 {cells}/uL (ref 850–3900)
Lymphocytes Relative: 30 %
MCH: 30.3 pg (ref 27.0–33.0)
MCHC: 33.3 g/dL (ref 32.0–36.0)
MCV: 91 fL (ref 80.0–100.0)
MPV: 8.8 fL (ref 7.5–12.5)
Monocytes Absolute: 623 cells/uL (ref 200–950)
Monocytes Relative: 7 %
NEUTROS ABS: 5518 {cells}/uL (ref 1500–7800)
NEUTROS PCT: 62 %
Platelets: 261 10*3/uL (ref 140–400)
RBC: 4.65 MIL/uL (ref 3.80–5.10)
RDW: 14 % (ref 11.0–15.0)
WBC: 8.9 10*3/uL (ref 3.8–10.8)

## 2016-07-22 LAB — TSH: TSH: 1.67 mIU/L

## 2016-07-22 MED ORDER — AZITHROMYCIN 250 MG PO TABS: ORAL_TABLET | ORAL | 1 refills | Status: AC

## 2016-07-22 MED ORDER — PREDNISONE 20 MG PO TABS: ORAL_TABLET | ORAL | 0 refills | Status: DC

## 2016-07-22 MED ORDER — CALCITONIN (SALMON) 200 UNIT/ACT NA SOLN: NASAL | 3 refills | Status: AC

## 2016-07-22 NOTE — Patient Instructions (Addendum)
Here are things you can do to help with this: - Try the Flonase or Nasonex. Remember to spray each nostril twice towards the outer part of your eye.  Do not sniff but instead pinch your nose and tilt your head back to help the medicine get into your sinuses.  The best time to do this is at bedtime.Stop if you get blurred vision or nose bleeds.   -While drinking fluids, pinch and hold nose close and swallow, to help open eustachian tubes to drain fluid behind ear drums.  -Please pick one of the over the counter allergy medications below and take it once daily for allergies.  It will also help with fluid behind ear drums. Claritin or loratadine cheapest but likely the weakest  Zyrtec or certizine at night because it can make you sleepy The strongest is allegra or fexafinadine   Cheapest at [facility], [employer], [employer] -can use decongestant over the counter, please do not use if you have high blood pressure or certain heart conditions.   if worsening HA, changes vision/speech, imbalance, weakness go to the ER   Make sure you are on an allergy pill, see below for more details. Please take the prednisone as directed below, this is NOT an antibiotic so you do NOT have to finish it. You can take it for a few days and stop it if you are doing better.   Please take the prednisone to help decrease inflammation and therefore decrease symptoms. Take it it with food to avoid GI upset. It can cause increased energy but on the other hand it can make it hard to sleep at night so please take it AT [place], it takes 8-12 hours to start working so it will NOT affect your sleeping if you take it at night with your food!!  If you are diabetic it will increase your sugars so decrease carbs and monitor your sugars closely.      Osteoporosis Osteoporosis is the thinning and loss of density in the bones. Osteoporosis makes the bones more brittle, fragile, and likely to break (fracture). Over time, osteoporosis  can cause the bones to become so weak that they fracture after a simple fall. The bones most likely to fracture are the bones in the hip, wrist, and spine. What are the causes? The exact cause is not known. What increases the risk? Anyone can develop osteoporosis. You may be at greater risk if you have a family history of the condition or have poor nutrition. You may also have a higher risk if you are:  Female.  5 years old or older.  A smoker.  Not physically active.  White or Asian.  Slender. What are the signs or symptoms? A fracture might be the first sign of the disease, especially if it results from a fall or injury that would not usually cause a bone to break. Other signs and symptoms include:  Low back and neck pain.  Stooped posture.  Height loss. How is this diagnosed? To make a diagnosis, your health care provider may:  Take a medical history.  Perform a physical exam.  Order tests, such as:  A bone mineral density test.  A dual-energy X-ray absorptiometry test. How is this treated? The goal of osteoporosis treatment is to strengthen your bones to reduce your risk of a fracture. Treatment may involve:  Making lifestyle changes, such as:  Eating a diet rich in calcium.  Doing weight-bearing and muscle-strengthening exercises.  Stopping tobacco use.  Limiting  alcohol intake.  Taking medicine to slow the process of bone loss or to increase bone density.  Monitoring your levels of calcium and vitamin D. Follow these instructions at home:  Include calcium and vitamin D in your diet. Calcium is important for bone health, and vitamin D helps the body absorb calcium.  Perform weight-bearing and muscle-strengthening exercises as directed by your health care provider.  Do not use any tobacco products, including cigarettes, chewing tobacco, and electronic cigarettes. If you need help quitting, ask your health care provider.  Limit your alcohol  intake.  Take medicines only as directed by your health care provider.  Keep all follow-up visits as directed by your health care provider. This is important.  Take precautions at home to lower your risk of falling, such as:  Keeping rooms well lit and clutter free.  Installing safety rails on stairs.  Using rubber mats in the bathroom and other areas that are often wet or slippery. Get help right away if: You fall or injure yourself. This information is not intended to replace advice given to you by your health care provider. Make sure you discuss any questions you have with your health care provider. Document Released: 12/10/2004 Document Revised: 08/05/2015 Document Reviewed: 08/10/2013 Elsevier Interactive Patient Education  2017 [employer].

## 2016-07-23 LAB — BASIC METABOLIC PANEL WITH GFR
BUN: 17 mg/dL (ref 7–25)
CALCIUM: 9.6 mg/dL (ref 8.6–10.4)
CO2: 26 mmol/L (ref 20–31)
Chloride: 103 mmol/L (ref 98–110)
Creat: 0.9 mg/dL (ref 0.50–0.99)
GFR, EST NON AFRICAN AMERICAN: 66 mL/min (ref >=60)
GFR, Est African American: 77 mL/min (ref >=60)
Glucose, Bld: 83 mg/dL (ref 65–99)
Potassium: 4.2 mmol/L (ref 3.5–5.3)
SODIUM: 140 mmol/L (ref 135–146)

## 2016-07-23 LAB — LIPID PANEL
CHOL/HDL RATIO: 1.9 ratio (ref <5.0)
CHOLESTEROL: 220 mg/dL — AB (ref <200)
HDL: 118 mg/dL (ref >50)
LDL Cholesterol: 86 mg/dL (ref <100)
TRIGLYCERIDES: 82 mg/dL (ref <150)
VLDL: 16 mg/dL (ref <30)

## 2016-07-23 LAB — HEPATIC FUNCTION PANEL
ALT: 11 U/L (ref 6–29)
AST: 17 U/L (ref 10–35)
Albumin: 4.5 g/dL (ref 3.6–5.1)
Alkaline Phosphatase: 41 U/L (ref 33–130)
BILIRUBIN INDIRECT: 0.4 mg/dL (ref 0.2–1.2)
BILIRUBIN TOTAL: 0.5 mg/dL (ref 0.2–1.2)
Bilirubin, Direct: 0.1 mg/dL (ref <=0.2)
Total Protein: 7 g/dL (ref 6.1–8.1)

## 2016-07-23 LAB — MAGNESIUM: Magnesium: 2.2 mg/dL (ref 1.5–2.5)

## 2016-08-06 DIAGNOSIS — L089 Local infection of the skin and subcutaneous tissue, unspecified: Secondary | ICD-10-CM | POA: Diagnosis not present

## 2016-08-06 DIAGNOSIS — Z029 Encounter for administrative examinations, unspecified: Secondary | ICD-10-CM | POA: Diagnosis not present

## 2016-11-12 DIAGNOSIS — M810 Age-related osteoporosis without current pathological fracture: Secondary | ICD-10-CM | POA: Diagnosis not present

## 2016-11-12 DIAGNOSIS — M8589 Other specified disorders of bone density and structure, multiple sites: Secondary | ICD-10-CM | POA: Diagnosis not present

## 2016-12-09 ENCOUNTER — Encounter: Payer: Self-pay | Admitting: Internal Medicine

## 2016-12-09 DIAGNOSIS — H6502 Acute serous otitis media, left ear: Secondary | ICD-10-CM | POA: Diagnosis not present

## 2016-12-09 DIAGNOSIS — H6123 Impacted cerumen, bilateral: Secondary | ICD-10-CM | POA: Diagnosis not present

## 2016-12-10 DIAGNOSIS — Z1231 Encounter for screening mammogram for malignant neoplasm of breast: Secondary | ICD-10-CM | POA: Diagnosis not present

## 2016-12-10 DIAGNOSIS — Z803 Family history of malignant neoplasm of breast: Secondary | ICD-10-CM | POA: Diagnosis not present

## 2016-12-10 LAB — HM MAMMOGRAPHY

## 2016-12-17 DIAGNOSIS — M25572 Pain in left ankle and joints of left foot: Secondary | ICD-10-CM | POA: Diagnosis not present

## 2016-12-17 DIAGNOSIS — M810 Age-related osteoporosis without current pathological fracture: Secondary | ICD-10-CM | POA: Diagnosis not present

## 2016-12-17 DIAGNOSIS — M25571 Pain in right ankle and joints of right foot: Secondary | ICD-10-CM | POA: Diagnosis not present

## 2017-01-06 DIAGNOSIS — H6982 Other specified disorders of Eustachian tube, left ear: Secondary | ICD-10-CM | POA: Diagnosis not present

## 2017-01-07 DIAGNOSIS — M810 Age-related osteoporosis without current pathological fracture: Secondary | ICD-10-CM | POA: Diagnosis not present

## 2017-01-11 ENCOUNTER — Encounter: Payer: Self-pay | Admitting: Internal Medicine

## 2017-01-24 NOTE — Progress Notes (Signed)
[facility] Unk Pinto, M.D.     [name]. [name], P.A.-C [name], [place] [address] [place], [place] [address] Telephone [phone] Telefax [phone] Annual Screening/Preventative Visit & Comprehensive Evaluation &  Examination     Sheila Gray is a very nice 66 y.o. MWF presents for a Screening/Preventative Visit & comprehensive evaluation and management of multiple medical co-morbidities.  Patient has been followed expectantly for labile elevated BP, Prediabetes, Hyperlipidemia and Vitamin D Deficiency. Patient had recent de[facility]BMD at [facility] & her Friend- Dr Isaiah Blakes had made a  Referral and patient presents today in a quandary about what avenue of treatment  to pursue and requests referral to an endocrinologist for another opinion.       Patient has been monitored expectantly for labile elevated BP.  Patient's BP has been controlled at home and patient denies any cardiac symptoms as chest pain, palpitations, shortness of breath, dizziness or ankle swelling. Today's BP is at goal - 120/74.     Patient's hyperlipidemia is controlled with diet. Last lipids were at goal with low LDL and elevated Chol: Lab Results  Component Value Date   CHOL 220 (H) 07/22/2016   HDL 118 07/22/2016   LDLCALC 86 07/22/2016   TRIG 82 07/22/2016   CHOLHDL 1.9 07/22/2016      Patient is also monitored expectantly for prediabetes and patient denies reactive hypoglycemic symptoms, visual blurring, diabetic polys, or paresthesias. Last A1c was normal: Lab Results  Component Value Date   HGBA1C 5.0 12/12/2015      Finally, patient has history of Vitamin D Deficiency and last Vitamin D was at goal: Lab Results  Component Value Date   VD25OH 54 12/12/2015   Current Outpatient Medications on File Prior to Visit  Medication Sig  . acetaminophen (TYLENOL) 500 MG tablet Take 500 mg as needed by mouth.  Marland Kitchen aspirin EC 81 MG  tablet Take 81 mg by mouth daily.  . Calcium Citrate-Vitamin D (CALCIUM CITRATE + D3 PO) Take 4 tablets daily by mouth.   . Cholecalciferol (VITAMIN D PO) Take 4,000 Units by mouth daily.  . fexofenadine (ALLEGRA) 180 MG tablet Take 180 mg by mouth daily.  . metroNIDAZOLE (METROCREAM) 0.75 % cream APPLY TO FACE AT BEDTIME  . Multiple Vitamin (MULTIVITAMIN) tablet Take 1 tablet by mouth daily.  . calcitonin, salmon, (MIACALCIN) 200 UNIT/ACT nasal spray 1 nasal spray dai;ly - alternating side every other day (Patient not taking: Reported on 01/25/2017)   No current facility-administered medications on file prior to visit.    Allergies  Allergen Reactions  . Amoxicillin Rash   Past Medical History:  Diagnosis Date  . Allergy   . Osteoporosis   . Vitamin D deficiency    Health Maintenance  Topic Date Due  . INFLUENZA VACCINE  10/14/2016  . MAMMOGRAM  12/11/2018  . COLONOSCOPY  11/27/2019  . TETANUS/TDAP  12/11/2025  . DEXA SCAN  Completed  . Hepatitis C Screening  Completed  . PNA vac Low Risk Adult  Completed   Immunization History  Administered Date(s) Administered  . Influenza-Unspecified 12/09/2015  . PPD Test 11/09/2013  . Pneumococcal Conjugate-13 03/29/2014  . Pneumococcal Polysaccharide-23 01/09/2016  . Td 05/14/2005, 12/12/2015  . Zoster 11/28/2009   No past surgical history on file. Family History  Problem Relation Age of Onset  . Hypertension Mother   . Kidney disease Father   . Heart disease Father    Social History   Tobacco  Use  . Smoking status: Never Smoker  . Smokeless tobacco: Never Used  Substance Use Topics  . Alcohol use: Yes    Alcohol/week: 1.5 oz    Types: 3 drink(s) per week  . Drug use: No    ROS Constitutional: Denies fever, chills, weight loss/gain, headaches, insomnia,  night sweats, and change in appetite. Does c/o fatigue. Eyes: Denies redness, blurred vision, diplopia, discharge, itchy, watery eyes.  ENT: Denies discharge,  congestion, post nasal drip, epistaxis, sore throat, earache, hearing loss, dental pain, Tinnitus, Vertigo, Sinus pain, snoring.  Cardio: Denies chest pain, palpitations, irregular heartbeat, syncope, dyspnea, diaphoresis, orthopnea, PND, claudication, edema Respiratory: denies cough, dyspnea, DOE, pleurisy, hoarseness, laryngitis, wheezing.  Gastrointestinal: Denies dysphagia, heartburn, reflux, water brash, pain, cramps, nausea, vomiting, bloating, diarrhea, constipation, hematemesis, melena, hematochezia, jaundice, hemorrhoids Genitourinary: Denies dysuria, frequency, urgency, nocturia, hesitancy, discharge, hematuria, flank pain Breast: Breast lumps, nipple discharge, bleeding.  Musculoskeletal: Denies arthralgia, myalgia, stiffness, Jt. Swelling, pain, limp, and strain/sprain. Denies falls. Skin: Denies puritis, rash, hives, warts, acne, eczema, changing in skin lesion Neuro: No weakness, tremor, incoordination, spasms, paresthesia, pain Psychiatric: Denies confusion, memory loss, sensory loss. Denies Depression. Endocrine: Denies change in weight, skin, hair change, nocturia, and paresthesia, diabetic polys, visual blurring, hyper / hypo glycemic episodes.  Heme/Lymph: No excessive bleeding, bruising, enlarged lymph nodes.  Physical Exam  BP 120/74   Pulse 79   Temp (!) 97.5 F (36.4 C)   Ht 5' 1.5" (1.562 m)   Wt 117 lb (53.1 kg)   SpO2 97%   BMI 21.75 kg/m   General Appearance: Well nourished, well groomed and in no apparent distress.  Eyes: PERRLA, EOMs, conjunctiva no swelling or erythema, normal fundi and vessels. Sinuses: No frontal/maxillary tenderness ENT/Mouth: EACs patent / TMs  nl. Nares clear without erythema, swelling, mucoid exudates. Oral hygiene is good. No erythema, swelling, or exudate. Tongue normal, non-obstructing. Tonsils not swollen or erythematous. Hearing normal.  Neck: Supple, thyroid normal. No bruits, nodes or JVD. Respiratory: Respiratory effort  normal.  BS equal and clear bilateral without rales, rhonci, wheezing or stridor. Cardio: Heart sounds are normal with regular rate and rhythm and no murmurs, rubs or gallops. Peripheral pulses are normal and equal bilaterally without edema. No aortic or femoral bruits. Chest: symmetric with normal excursions and percussion. Breasts: Symmetric, without lumps, nipple discharge, retractions, or fibrocystic changes.  Abdomen: Flat, soft with bowel sounds active. Nontender, no guarding, rebound, hernias, masses, or organomegaly.  Lymphatics: Non tender without lymphadenopathy.  Genitourinary:  Musculoskeletal: Full ROM all peripheral extremities, joint stability, 5/5 strength, and normal gait. Skin: Warm and dry without rashes, lesions, cyanosis, clubbing or  ecchymosis.  Neuro: Cranial nerves intact, reflexes equal bilaterally. Normal muscle tone, no cerebellar symptoms. Sensation intact.  Pysch: Alert and oriented X 3, normal affect, Insight and Judgment appropriate.   Assessment and Plan  1. Encounter for general adult medical examination with abnormal findings  2. Elevated BP without diagnosis of hypertension  - EKG 12-Lead - Urinalysis, Routine w reflex microscopic - Microalbumin / creatinine urine ratio - CBC with Differential/Platelet - BASIC METABOLIC PANEL WITH GFR - Magnesium - TSH  3. Hyperlipidemia, mixed  - EKG 12-Lead - Hepatic function panel - Lipid panel - TSH  4. Other abnormal glucose  - Hemoglobin A1c - Insulin, random  5. Vitamin D deficiency  - VITAMIN D 25 Hydroxy   6. Age-related osteoporosis without current pathological fracture  - Ambulatory referral to Endocrinology  7. Screening for colorectal cancer  -  POC Hemoccult Bld/Stl  8. Screening for ischemic heart disease  - EKG 12-Lead  9. Medication management  - Urinalysis, Routine w reflex microscopic - Microalbumin / creatinine urine ratio - CBC with Differential/Platelet - BASIC  METABOLIC PANEL WITH GFR - Hepatic function panel - Magnesium - Lipid panel - TSH - Hemoglobin A1c - Insulin, random - VITAMIN D 25 Hydroxy        Patient was counseled in prudent diet to achieve/maintain BMI less than 25 for weight control, BP monitoring, regular exercise and medications. Discussed med's effects and SE's. Screening labs and tests as requested with regular follow-up as recommended. Over 40 minutes of exam, counseling, chart review and high complex critical decision making was performed.

## 2017-01-24 NOTE — Patient Instructions (Signed)

## 2017-01-25 ENCOUNTER — Ambulatory Visit (INDEPENDENT_AMBULATORY_CARE_PROVIDER_SITE_OTHER): Payer: Medicare Other | Admitting: Internal Medicine

## 2017-01-25 ENCOUNTER — Other Ambulatory Visit: Payer: Self-pay | Admitting: Internal Medicine

## 2017-01-25 ENCOUNTER — Encounter: Payer: Self-pay | Admitting: Internal Medicine

## 2017-01-25 VITALS — BP 120/74 | HR 79 | Temp 97.5°F | Ht 61.5 in | Wt 117.0 lb

## 2017-01-25 DIAGNOSIS — E782 Mixed hyperlipidemia: Secondary | ICD-10-CM | POA: Diagnosis not present

## 2017-01-25 DIAGNOSIS — E559 Vitamin D deficiency, unspecified: Secondary | ICD-10-CM

## 2017-01-25 DIAGNOSIS — Z0000 Encounter for general adult medical examination without abnormal findings: Secondary | ICD-10-CM | POA: Diagnosis not present

## 2017-01-25 DIAGNOSIS — R030 Elevated blood-pressure reading, without diagnosis of hypertension: Secondary | ICD-10-CM | POA: Diagnosis not present

## 2017-01-25 DIAGNOSIS — Z79899 Other long term (current) drug therapy: Secondary | ICD-10-CM | POA: Diagnosis not present

## 2017-01-25 DIAGNOSIS — Z0001 Encounter for general adult medical examination with abnormal findings: Secondary | ICD-10-CM

## 2017-01-25 DIAGNOSIS — M810 Age-related osteoporosis without current pathological fracture: Secondary | ICD-10-CM

## 2017-01-25 DIAGNOSIS — R7309 Other abnormal glucose: Secondary | ICD-10-CM | POA: Diagnosis not present

## 2017-01-25 DIAGNOSIS — Z1212 Encounter for screening for malignant neoplasm of rectum: Secondary | ICD-10-CM

## 2017-01-25 DIAGNOSIS — Z136 Encounter for screening for cardiovascular disorders: Secondary | ICD-10-CM | POA: Diagnosis not present

## 2017-01-25 DIAGNOSIS — Z1211 Encounter for screening for malignant neoplasm of colon: Secondary | ICD-10-CM

## 2017-01-26 ENCOUNTER — Other Ambulatory Visit: Payer: Self-pay | Admitting: Internal Medicine

## 2017-01-26 DIAGNOSIS — M810 Age-related osteoporosis without current pathological fracture: Secondary | ICD-10-CM

## 2017-01-27 ENCOUNTER — Other Ambulatory Visit: Payer: Medicare Other

## 2017-01-27 DIAGNOSIS — M810 Age-related osteoporosis without current pathological fracture: Secondary | ICD-10-CM | POA: Diagnosis not present

## 2017-01-27 LAB — HEMOGLOBIN A1C
HEMOGLOBIN A1C: 5.1 %{Hb} (ref <5.7)
MEAN PLASMA GLUCOSE: 100 (calc)
eAG (mmol/L): 5.5 (calc)

## 2017-01-27 LAB — INSULIN, RANDOM: Insulin: 4.3 u[IU]/mL (ref 2.0–19.6)

## 2017-01-27 LAB — BASIC METABOLIC PANEL WITH GFR
BUN: 15 mg/dL (ref 7–25)
CHLORIDE: 103 mmol/L (ref 98–110)
CO2: 33 mmol/L — AB (ref 20–32)
Calcium: 10.7 mg/dL — ABNORMAL HIGH (ref 8.6–10.4)
Creat: 0.85 mg/dL (ref 0.50–0.99)
GFR, EST AFRICAN AMERICAN: 82 mL/min/{1.73_m2} (ref >=60)
GFR, EST NON AFRICAN AMERICAN: 71 mL/min/{1.73_m2} (ref >=60)
Glucose, Bld: 92 mg/dL (ref 65–99)
POTASSIUM: 5.1 mmol/L (ref 3.5–5.3)
Sodium: 143 mmol/L (ref 135–146)

## 2017-01-27 LAB — CBC WITH DIFFERENTIAL/PLATELET
BASOS ABS: 27 {cells}/uL (ref 0–200)
Basophils Relative: 0.4 %
EOS ABS: 82 {cells}/uL (ref 15–500)
EOS PCT: 1.2 %
HEMATOCRIT: 43.4 % (ref 35.0–45.0)
HEMOGLOBIN: 14.5 g/dL (ref 11.7–15.5)
LYMPHS ABS: 1768 {cells}/uL (ref 850–3900)
MCH: 30.1 pg (ref 27.0–33.0)
MCHC: 33.4 g/dL (ref 32.0–36.0)
MCV: 90 fL (ref 80.0–100.0)
MPV: 8.8 fL (ref 7.5–12.5)
Monocytes Relative: 6.8 %
NEUTROS ABS: 4461 {cells}/uL (ref 1500–7800)
Neutrophils Relative %: 65.6 %
Platelets: 233 10*3/uL (ref 140–400)
RBC: 4.82 10*6/uL (ref 3.80–5.10)
RDW: 13 % (ref 11.0–15.0)
Total Lymphocyte: 26 %
WBC: 6.8 10*3/uL (ref 3.8–10.8)
WBCMIX: 462 {cells}/uL (ref 200–950)

## 2017-01-27 LAB — LIPID PANEL
CHOL/HDL RATIO: 2.7 (calc) (ref <5.0)
Cholesterol: 250 mg/dL — ABNORMAL HIGH (ref <200)
HDL: 92 mg/dL (ref >50)
LDL CHOLESTEROL (CALC): 124 mg/dL — AB
NON-HDL CHOLESTEROL (CALC): 158 mg/dL — AB (ref <130)
Triglycerides: 216 mg/dL — ABNORMAL HIGH (ref <150)

## 2017-01-27 LAB — URINALYSIS, ROUTINE W REFLEX MICROSCOPIC
Bilirubin Urine: NEGATIVE
Glucose, UA: NEGATIVE
Hgb urine dipstick: NEGATIVE
KETONES UR: NEGATIVE
LEUKOCYTES UA: NEGATIVE
NITRITE: NEGATIVE
Protein, ur: NEGATIVE
SPECIFIC GRAVITY, URINE: 1.01 (ref 1.001–1.03)
pH: 7.5 (ref 5.0–8.0)

## 2017-01-27 LAB — HEPATIC FUNCTION PANEL
AG Ratio: 1.9 (calc) (ref 1.0–2.5)
ALT: 14 U/L (ref 6–29)
AST: 17 U/L (ref 10–35)
Albumin: 4.7 g/dL (ref 3.6–5.1)
Alkaline phosphatase (APISO): 50 U/L (ref 33–130)
BILIRUBIN DIRECT: 0.1 mg/dL (ref 0.0–0.2)
BILIRUBIN INDIRECT: 0.3 mg/dL (ref 0.2–1.2)
BILIRUBIN TOTAL: 0.4 mg/dL (ref 0.2–1.2)
GLOBULIN: 2.5 g/dL (ref 1.9–3.7)
Total Protein: 7.2 g/dL (ref 6.1–8.1)

## 2017-01-27 LAB — MICROALBUMIN / CREATININE URINE RATIO: CREATININE, URINE: 36 mg/dL (ref 20–275)

## 2017-01-27 LAB — TSH: TSH: 2.91 m[IU]/L (ref 0.40–4.50)

## 2017-01-27 LAB — VITAMIN D 25 HYDROXY (VIT D DEFICIENCY, FRACTURES): Vit D, 25-Hydroxy: 65 ng/mL (ref 30–100)

## 2017-01-27 LAB — MAGNESIUM: MAGNESIUM: 2.4 mg/dL (ref 1.5–2.5)

## 2017-01-28 ENCOUNTER — Telehealth: Payer: Self-pay

## 2017-01-28 LAB — PTH, INTACT AND CALCIUM
Calcium: 9.9 mg/dL (ref 8.6–10.4)
PTH: 37 pg/mL (ref 14–64)

## 2017-01-28 LAB — CALCIUM, IONIZED: CALCIUM ION: 5.4 mg/dL (ref 4.8–5.6)

## 2017-01-28 NOTE — Telephone Encounter (Signed)
Pt reports that Dr. Ladell Heads can see her the last week of Jan. Pt wants to know is it ok to take this appt or should she be seen sooner.   Per provider it is ok to wait.

## 2017-04-13 ENCOUNTER — Encounter: Payer: Self-pay | Admitting: Internal Medicine

## 2017-04-13 ENCOUNTER — Ambulatory Visit (INDEPENDENT_AMBULATORY_CARE_PROVIDER_SITE_OTHER): Payer: Medicare Other | Admitting: Internal Medicine

## 2017-04-13 VITALS — BP 118/68 | HR 85 | Ht 61.5 in | Wt 116.0 lb

## 2017-04-13 DIAGNOSIS — M810 Age-related osteoporosis without current pathological fracture: Secondary | ICD-10-CM | POA: Diagnosis not present

## 2017-04-13 DIAGNOSIS — E559 Vitamin D deficiency, unspecified: Secondary | ICD-10-CM | POA: Diagnosis not present

## 2017-04-13 NOTE — Patient Instructions (Addendum)
Please stop at the lab.  Please look into OsteoStrong.  Check out the book:   Exercise for Strong Bones (from [place]) There are two types of exercises that are important for building and maintaining bone density:  weight-bearing and muscle-strengthening exercises. Weight-bearing Exercises These exercises include activities that make you move against gravity while staying upright. Weight-bearing exercises can be high-impact or low-impact. High-impact weight-bearing exercises help build bones and keep them strong. If you have broken a bone due to osteoporosis or are at risk of breaking a bone, you may need to avoid high-impact exercises. If you're not sure, you should check with your healthcare provider. Examples of high-impact weight-bearing exercises are: . Dancing . Doing high-impact aerobics . Hiking . Jogging/running . Jumping Rope . Stair climbing . Tennis Low-impact weight-bearing exercises can also help keep bones strong and are a safe alternative if you cannot do high-impact exercises. Examples of low-impact weight-bearing exercises are: . Using elliptical training machines . Doing low-impact aerobics . Using stair-step machines . Fast walking on a treadmill or outside Muscle-Strengthening Exercises These exercises include activities where you move your body, a weight or some other resistance against gravity. They are also known as resistance exercises and include: . Lifting weights . Using elastic exercise bands . Using weight machines . Lifting your own body weight . Functional movements, such as standing and rising up on your toes Yoga and Pilates can also improve strength, balance and flexibility. However, certain positions may not be safe for people with osteoporosis or those at increased risk of broken bones. For example, exercises that have you bend forward may increase the chance of breaking a bone in the spine. A physical therapist should be  able to help you learn which exercises are safe and appropriate for you. Non-Impact Exercises Non-impact exercises can help you to improve balance, posture and how well you move in everyday activities. These exercises can also help to increase muscle strength and decrease the risk of falls and broken bones. Some of these exercises include: . Balance exercises that strengthen your legs and test your balance, such as Tai Chi, can decrease your risk of falls. . Posture exercises that improve your posture and reduce rounded or "sloping" shoulders can help you decrease the chance of breaking a bone, especially in the spine. . Functional exercises that improve how well you move can help you with everyday activities and decrease your chance of falling and breaking a bone. For example, if you have trouble getting up from a chair or climbing stairs, you should do these activities as exercises. A physical therapist can teach you balance, posture and functional exercises. Starting a New Exercise Program If you haven't exercised regularly for a while, check with your healthcare provider before beginning a new exercise program-particularly if you have health problems such as heart disease, diabetes or high blood pressure. If you're at high risk of breaking a bone, you should work with a physical therapist to develop a safe exercise program. Once you have your healthcare provider's approval, start slowly. If you've already broken bones in the spine because of osteoporosis, be very careful to avoid activities that require reaching down, bending forward, rapid twisting motions, heavy lifting and those that increase your chance of a fall. As you get started, your muscles may feel sore for a day or two after you exercise. If soreness lasts longer, you may be working too hard and need to ease up. Exercises should be done in a pain-free  range of motion. How Much Exercise Do You Need? Weight-bearing exercises 30 minutes on  most days of the week. Do a 30-minutesession or multiple sessions spread out throughout the day. The benefits to your bones are the same.   Muscle-strengthening exercises Two to three days per week. If you don't have much time for strengthening/resistance training, do small amounts at a time. You can do just one body part each day. For example do arms one day, legs the next and trunk the next. You can also spread these exercises out during your normal day.  Balance, posture and functional exercises Every day or as often as needed. You may want to focus on one area more than the others. If you have fallen or lose your balance, spend time doing balance exercises. If you are getting rounded shoulders, work more on posture exercises. If you have trouble climbing stairs or getting up from the couch, do more functional exercises. You can also perform these exercises at one time or spread them during your day. Work with a phyiscal therapist to learn the right exercises for you.    Please contact me in 1 year to order the bone density.  Please come back for a follow-up appointment in 1 year.

## 2017-04-13 NOTE — Progress Notes (Signed)
Patient ID: Sheila Gray, female   DOB: 1950/07/24, 67 y.o.   MRN: [id number]    HPI  Sheila Gray is a 67 y.o.-year-old female, referred by her PCP, Dr. Melford Aase, for management of osteoporosis.  She also has a history of vitamin D deficiency.  Pt was dx with OP in 2016. She had a ruptured disk in 2016 >> less active. In 2018, T scores were worse.  I reviewed pt's DEXA scans: Date L1-L4 T score FN T score 33% distal Radius  11/12/2016 ([facility])  -2.0 (-6.3%*) RFN: -3.0 LFN: -3.4   11/09/2014 ([facility])  -1.6 RFN: -2.3 LFN: -2.6 n/a   She denies fractures. No dizziness/vertigo/orthostasis/poor vision. No falls.  Previous OP treatments:  - Actonel 1990s - Fosamax 1990s  Took bisphosphonates for 6 years. >> stopped when aunt developed ONJ (she had metastatic cancer) - Calcitonin ~2 years in 2014, then ~1.5 years 2 years ago.  + h/o vitamin D deficiency. Reviewed available vit D levels -normal: Lab Results  Component Value Date   VD25OH 65 01/25/2017   VD25OH 54 12/12/2015   VD25OH 71 06/19/2015   VD25OH 60 11/13/2014   VD25OH 60 11/09/2013   Pt is on calcium citrate - 850 mg; but is on vitamin D supplement - 5000 IU/day.   + started weight bearing exercises: walking  - 4 mi per weekend, stair walking in the house; Qi gong.   She does not take high vitamin A doses.  Menopause was at 67 y/o - was on Tamoxifen - prophylactically (!) in the 90s as she had extensive FH of BrCA.  Pt does have a FH of osteoporosis MGM, M aunt  - broke hips (? OP in mother, but unclear)  No h/o persistent hyper/hypocalcemia or hyperparathyroidism. No h/o kidney stones. Lab Results  Component Value Date   PTH 37 01/27/2017   CALCIUM 9.9 01/27/2017   CALCIUM 10.7 (H) 01/25/2017   CALCIUM 9.6 07/22/2016   CALCIUM 9.5 12/12/2015   CALCIUM 10.3 06/19/2015   CALCIUM 10.1 11/13/2014   CALCIUM 9.2 11/09/2013   CALCIUM 10.2 04/10/2013   No h/o thyrotoxicosis. Reviewed TSH recent levels:  Lab  Results  Component Value Date   TSH 2.91 01/25/2017   TSH 1.67 07/22/2016   TSH 2.85 12/12/2015   TSH 3.06 06/19/2015   TSH 4.099 11/13/2014   No h/o CKD. Last BUN/Cr: Lab Results  Component Value Date   BUN 15 01/25/2017   CREATININE 0.85 01/25/2017   Diet: She cut out dairy, caffeine, carbonated drinks.  Decrease salt.  ROS: Constitutional: no weight gain/loss, no fatigue, no subjective hyperthermia/hypothermia Eyes: no blurry vision, no xerophthalmia ENT: no sore throat, no nodules palpated in throat, no dysphagia/odynophagia, no hoarseness Cardiovascular: no CP/SOB/palpitations/leg swelling Respiratory: no cough/SOB Gastrointestinal: no N/V/D/C Musculoskeletal: no muscle/+ joint aches Skin: no rashes, + hair loss Neurological: no tremors/numbness/tingling/dizziness Psychiatric: no depression/anxiety  Past Medical History:  Diagnosis Date  . Allergy   . Osteoporosis   . Vitamin D deficiency    Hyperlipidemia  C5-C6 stenosis  Rosacea  Lumbar ruptured disc 2016  No past surgical history on file. Social History   Socioeconomic History  . Marital status: Married    Spouse name: Not on file  . Number of children: 3  Social Needs  Occupational History  .  Works in an Tax adviser  Tobacco Use  . Smoking status: Never Smoker  . Smokeless tobacco: Never Used  Substance and Sexual Activity  . Alcohol  use: Yes    Alcohol/week: 1.5 oz    Types: 3 drink(s) per week  . Drug use: No   Current Outpatient Medications on File Prior to Visit  Medication Sig Dispense Refill  . acetaminophen (TYLENOL) 500 MG tablet Take 500 mg as needed by mouth.    Marland Kitchen aspirin EC 81 MG tablet Take 81 mg by mouth daily.    . Calcium Citrate-Vitamin D (CALCIUM CITRATE + D3 PO) Take 4 tablets daily by mouth.     . Cholecalciferol (VITAMIN D PO) Take 4,000 Units by mouth daily.    . fexofenadine (ALLEGRA) 180 MG tablet Take 180 mg by mouth daily.    . metroNIDAZOLE (METROCREAM)  0.75 % cream APPLY TO FACE AT BEDTIME  10  . Multiple Vitamin (MULTIVITAMIN) tablet Take 1 tablet by mouth daily.    . calcitonin, salmon, (MIACALCIN) 200 UNIT/ACT nasal spray 1 nasal spray dai;ly - alternating side every other day (Patient not taking: Reported on 04/13/2017) 11.1 mL 3   No current facility-administered medications on file prior to visit.    Allergies  Allergen Reactions  . Amoxicillin Rash   Family History  Problem Relation Age of Onset  . Hypertension Mother   . Kidney disease Father   . Heart disease Father     PE: BP 118/68   Pulse 85   Ht 5' 1.5" (1.562 m)   Wt 116 lb (52.6 kg)   SpO2 98%   BMI 21.56 kg/m  Wt Readings from Last 3 Encounters:  04/13/17 116 lb (52.6 kg)  01/25/17 117 lb (53.1 kg)  07/22/16 112 lb (50.8 kg)   Constitutional: Thin, in NAD. No kyphosis. Eyes: PERRLA, EOMI, no exophthalmos ENT: moist mucous membranes, no thyromegaly, no cervical lymphadenopathy Cardiovascular: RRR, No MRG Respiratory: CTA B Gastrointestinal: abdomen soft, NT, ND, BS+ Musculoskeletal: no deformities, strength intact in all 4 Skin: moist, warm, no rashes Neurological: no tremor with outstretched hands, DTR normal in all 4  Assessment: 1. Osteoporosis  2.  History of vitamin D deficiency  Plan: 1. Osteoporosis - likely age-related + postmenopausal, she has FH of OP - Discussed about increased risk of fracture, depending on the T score, greatly increased when the T score is lower than -2.5, but it is actually a continuum and -2.5 should not be regarded as an absolute threshold. We reviewed her last 2 DXA scan report and imagess  together, and I explained that based on the T scores, she has an increased risk for fractures.  There is a steep decrease in the bone density scores from 2016 and 2018 and part of the discrepancy could be due to changing the DXA machine type.  However, I think a larger part displayed by patient being relatively inactive after her  ruptured disc in 2016; she did restart exercise: She is walking, which should help, but the rest of the exercises that she does are not very weightbearing.  They do help with balance, though, so I encouraged her to continue.  I will also give her a list of exercises recommended by the National osteoporosis foundation - we reviewed her dietary and supplemental calcium and vitamin D intake. I recommended to make sure she gets 1000-1200 mg of calcium daily preferentially from diet and also she gets enough Vit D for the last vitamin D level from 01/2017 - discussed fall precautions   - I also recommended the [facility] - for skeletal loading. Given brochure and explained benefits. - we discussed about maintaining  a good amount of protein in her diet. The recommended daily protein intake is ~0.8 g per kilogram per day (for her approximately 45 g). I advised her to try to aim for this amount, since a diet low in proteins can exacerbate osteoporosis. Also, avoid smoking or >2 drinks of alcohol a day. - I recommended the following book that details optimal diet for bone health - we discussed about the benefits of a low acid diet:  - We discussed about the different medication classes, benefits and side effects (including atypical fractures and ONJ - no dental workup in progress or planned).  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  She also would like to avoid teriparatide/abaloparatide (which are not known to cause ONJ). We decided to start more weightbearing exercises, continue to improve her diet and also start skeletal loading and recheck her bone density in a year.  If bone density is lower, we can reconsider osteoporosis medications. I explained that slightly higher T-scores are desirable, but the quality of bone may not be always reflected an  improvement in BMD scores. - will see pt back in a year -she will get in touch with me before our appointment so I can order her bone density scan   2.  History of vitamin D deficiency -She takes 5000 units vitamin D daily + multivitamin -Latest level from 01/2017 reviewed: At goal  - time spent with the patient: 1 hour, of which >50% was spent in obtaining information about her osteoporosis history, reviewing her previous labs, evaluations, and treatments, counseling her about her condition (please see the discussed topics above), and developing a plan to further investigate and treated; she had a number of questions which I addressed.   Philemon Kingdom, MD PhD [facility]

## 2017-07-21 NOTE — Progress Notes (Signed)
MEDICARE ANNUAL WELLNESS VISIT AND CPE  Assessment:    Seasonal allergic rhinitis due to pollen Continue OTC allergy pills  Osteoporosis, unspecified osteoporosis type, unspecified pathological fracture presence -    Continue follow up  Vitamin D deficiency Continue supplement  Elevated BP without diagnosis of hypertension - continue medications, DASH diet, exercise and monitor at home. Call if greater than 130/80.  -     CBC with Differential/Platelet -     BASIC METABOLIC PANEL WITH GFR -     Hepatic function panel -     TSH  Hyperlipidemia, unspecified hyperlipidemia type -continue medications, check lipids, decrease fatty foods, increase activity.  -     Lipid panel  Medication management -     Magnesium  Encounter for Medicare annual wellness exam 1 year  Anal Fissure Moistened tissue Warm tub soaks We discussed anal fissures and long healing process- she will try the above regimen over the next 6-8 weeks  Will refer to GI for possible colonoscopy to look for other pathology -     hydrocortisone-pramoxine (ANALPRAM-HC) 2.5-1 % rectal cream; Place 1 application rectally 3 (three) times daily.  Pain in axilla, unspecified laterality and lymphadenopathy -     US BREAST LTD UNI RIGHT INC AXILLA; Future -     US BREAST LTD UNI LEFT INC AXILLA; Future -     MM DIAG BREAST TOMO BILATERAL; Future  Urinary frequency -     Urine Culture -     Urinalysis, Routine w reflex microscopic   Over 30 minutes of exam, counseling, chart review and critical decision making was performed Future Appointments  Date Time Provider Icard  03/14/2018 10:00 AM Unk Pinto, MD [facility] None  04/13/2018 10:30 AM Philemon Kingdom, MD [facility] None     Plan:   During the course of the visit the patient was educated and counseled about appropriate screening and preventive services including:    Pneumococcal vaccine   Prevnar 13  Influenza vaccine  Td  vaccine  Screening electrocardiogram  Bone densitometry screening  Colorectal cancer screening  Diabetes screening  Glaucoma screening  Nutrition counseling   Advanced directives: requested   Subjective:  Sheila Gray is a 67 y.o. female who presents for Medicare Annual Wellness Visit and complete physical.    She has has bilateral axilla pain, has had a long time, would like to get checked out.   She has had burning rectum pain with BRBPR on the TP occ in the toliet for months. Just with sitting pain, no itching. She has increased fiber and has had larger stools, no pain with BM's. She used some hemorrhoid cream but no consistent with it.    Her blood pressure has been controlled at home, today their BP is BP: 114/70 She does workout. She denies chest pain, shortness of breath, dizziness.   She is seeing Dr. Darnell Level for osteoporosis, doing osteostrong and exercising.   BMI is Body mass index is 21.64 kg/m., she is working on diet and exercise. Wt Readings from Last 3 Encounters:  07/26/17 116 lb 6.4 oz (52.8 kg)  04/13/17 116 lb (52.6 kg)  01/25/17 117 lb (53.1 kg)   She is on cholesterol medication and denies myalgias. Her cholesterol is at goal. The cholesterol last visit was:   Lab Results  Component Value Date   CHOL 250 (H) 01/25/2017   HDL 92 01/25/2017   LDLCALC 124 (H) 01/25/2017   TRIG 216 (H) 01/25/2017   CHOLHDL 2.7  01/25/2017   Last A1C Lab Results  Component Value Date   HGBA1C 5.1 01/25/2017   Last GFR: Lab Results  Component Value Date   GFRNONAA 71 01/25/2017   Patient is on Vitamin D supplement.   Lab Results  Component Value Date   VD25OH 65 01/25/2017      Medication Review: Current Outpatient Medications on File Prior to Visit  Medication Sig Dispense Refill  . acetaminophen (TYLENOL) 500 MG tablet Take 500 mg as needed by mouth.    Marland Kitchen aspirin EC 81 MG tablet Take 81 mg by mouth daily.    . Calcium Citrate-Vitamin D (CALCIUM CITRATE + D3  PO) Take 4 tablets daily by mouth.     . Cholecalciferol (VITAMIN D PO) Take 4,000 Units by mouth daily.    . fexofenadine (ALLEGRA) 180 MG tablet Take 180 mg by mouth daily.    . metroNIDAZOLE (METROCREAM) 0.75 % cream APPLY TO FACE AT BEDTIME  10  . Multiple Vitamin (MULTIVITAMIN) tablet Take 1 tablet by mouth daily.     No current facility-administered medications on file prior to visit.     Allergies  Allergen Reactions  . Amoxicillin Rash    Current Problems (verified) Patient Active Problem List   Diagnosis Date Noted  . Elevated BP without diagnosis of hypertension 06/19/2015  . Hyperlipidemia 06/19/2015  . Medication management 06/19/2015  . Allergic rhinitis 04/10/2013  . Osteoporosis 04/10/2013  . Vitamin D deficiency 04/10/2013    Screening Tests Immunization History  Administered Date(s) Administered  . Influenza-Unspecified 12/09/2015  . PPD Test 11/09/2013  . Pneumococcal Conjugate-13 03/29/2014  . Pneumococcal Polysaccharide-23 01/09/2016  . Td 05/14/2005, 12/12/2015  . Zoster 11/28/2009    Preventative care: Last colonoscopy: 2011 Last mammogram: 11/2016 Last pap smear/pelvic exam: never abnormal pap - has OV next month DEXA: following Dr. Darnell Level  Prior vaccinations: TD or Tdap: 2017 Influenza: 2017 Pneumococcal: 2017 Prevnar13: 2016 Shingles/Zostavax: 2011  Names of Other Physician/Practitioners you currently use: 1. [facility] here for primary care 2. Stengel, eye doctor, last visit 2018 3. Shanon Brow, dentist, last visit appt coming up Patient Care Team: Unk Pinto, MD as PCP - General (Internal Medicine) Inda Castle, MD (Inactive) as Consulting Physician (Gastroenterology) Love, Alyson Locket, MD as Consulting Physician (Neurology) Rockwell [place] Ander Slade, MD (Rheumatology) Lavonna Monarch, MD as Consulting Physician (Dermatology) Garald Balding, MD as Consulting Physician (Orthopedic Surgery) Gus Height, MD  as Consulting Physician (Obstetrics and Gynecology)  SURGICAL HISTORY She  has no past surgical history on file. FAMILY HISTORY Her family history includes Heart disease in her father; Hypertension in her mother; Kidney disease in her father. SOCIAL HISTORY She  reports that she has never smoked. She has never used smokeless tobacco. She reports that she drinks about 1.5 oz of alcohol per week. She reports that she does not use drugs.   MEDICARE WELLNESS OBJECTIVES: Physical activity: Current Exercise Habits: Home exercise routine, Type of exercise: walking;strength training/weights, Time (Minutes): 30, Frequency (Times/Week): 5, Weekly Exercise (Minutes/Week): 150, Intensity: Moderate Cardiac risk factors: Cardiac Risk Factors include: advanced age (>87men, >82 women);dyslipidemia;hypertension Depression/mood screen:   Depression screen [facility] 2/9 07/26/2017  Decreased Interest 0  Down, Depressed, Hopeless 0  PHQ - 2 Score 0    ADLs:  In your present state of health, do you have any difficulty performing the following activities: 07/26/2017 01/25/2017  Hearing? N N  Vision? N N  Difficulty concentrating or making decisions? N N  Walking  or climbing stairs? N N  Dressing or bathing? N N  Doing errands, shopping? N N  Some recent data might be hidden     Cognitive Testing  Alert? Yes  Normal Appearance?Yes  Oriented to person? Yes  Place? Yes   Time? Yes  Recall of three objects?  Yes  Can perform simple calculations? Yes  Displays appropriate judgment?Yes  Can read the correct time from a watch face?Yes  EOL planning: Does Patient Have a Medical Advance Directive?: Yes Type of Advance Directive: Healthcare Power of Attorney, Living will Copy of [place] in Chart?: No - copy requested  Review of Systems  Constitutional: Negative.   HENT: Negative.   Eyes: Negative.   Respiratory: Positive for cough. Negative for hemoptysis, sputum production, shortness of  breath and wheezing.   Cardiovascular: Negative.   Gastrointestinal: Negative.   Genitourinary: Negative.   Musculoskeletal: Negative.   Skin: Negative.   Neurological: Negative.   Endo/Heme/Allergies: Negative.   Psychiatric/Behavioral: Negative.      Objective:     Today's Vitals   07/26/17 1023  BP: 114/70  Pulse: 75  Resp: 16  Temp: (!) 97.5 F (36.4 C)  SpO2: 99%  Weight: 116 lb 6.4 oz (52.8 kg)  Height: 5' 1.5" (1.562 m)  PainSc: 0-No pain   Body mass index is 21.64 kg/m.  General appearance: alert, no distress, WD/WN, female HEENT: normocephalic, sclerae anicteric, TMs pearly, nares patent, no discharge or erythema, pharynx normal Oral cavity: MMM, no lesions Neck: supple, no lymphadenopathy, no thyromegaly, no masses Heart: RRR, normal S1, S2, no murmurs Lungs: CTA bilaterally, no wheezes, rhonchi, or rales Abdomen: +bs, soft, non tender, non distended, no masses, no hepatomegaly, no splenomegaly Musculoskeletal: nontender, no swelling, no obvious deformity Extremities: no edema, no cyanosis, no clubbing, + pain bilateral axilla to palpation, with mobile nodule right more prominent than left.  Pulses: 2+ symmetric, upper and lower extremities, normal cap refill Neurological: alert, oriented x 3, CN2-12 intact, strength normal upper extremities and lower extremities, sensation normal throughout, DTRs 2+ throughout, no cerebellar signs, gait normal Psychiatric: normal affect, behavior normal, pleasant   Medicare Attestation I have personally reviewed: The patient's medical and social history Their use of alcohol, tobacco or illicit drugs Their current medications and supplements The patient's functional ability including ADLs,fall risks, home safety risks, cognitive, and hearing and visual impairment Diet and physical activities Evidence for depression or mood disorders  The patient's weight, height, BMI, and visual acuity have been recorded in the chart.  I  have made referrals, counseling, and provided education to the patient based on review of the above and I have provided the patient with a written personalized care plan for preventive services.     Vicie Mutters, PA-C   07/26/2017

## 2017-07-26 ENCOUNTER — Other Ambulatory Visit: Payer: Self-pay | Admitting: Physician Assistant

## 2017-07-26 ENCOUNTER — Ambulatory Visit (INDEPENDENT_AMBULATORY_CARE_PROVIDER_SITE_OTHER): Payer: Medicare Other | Admitting: Physician Assistant

## 2017-07-26 ENCOUNTER — Encounter: Payer: Self-pay | Admitting: Physician Assistant

## 2017-07-26 VITALS — BP 114/70 | HR 75 | Temp 97.5°F | Resp 16 | Ht 61.5 in | Wt 116.4 lb

## 2017-07-26 DIAGNOSIS — R6889 Other general symptoms and signs: Secondary | ICD-10-CM

## 2017-07-26 DIAGNOSIS — M79629 Pain in unspecified upper arm: Secondary | ICD-10-CM

## 2017-07-26 DIAGNOSIS — M810 Age-related osteoporosis without current pathological fracture: Secondary | ICD-10-CM | POA: Diagnosis not present

## 2017-07-26 DIAGNOSIS — Z0000 Encounter for general adult medical examination without abnormal findings: Secondary | ICD-10-CM

## 2017-07-26 DIAGNOSIS — K602 Anal fissure, unspecified: Secondary | ICD-10-CM

## 2017-07-26 DIAGNOSIS — Z79899 Other long term (current) drug therapy: Secondary | ICD-10-CM

## 2017-07-26 DIAGNOSIS — J301 Allergic rhinitis due to pollen: Secondary | ICD-10-CM

## 2017-07-26 DIAGNOSIS — E785 Hyperlipidemia, unspecified: Secondary | ICD-10-CM

## 2017-07-26 DIAGNOSIS — R590 Localized enlarged lymph nodes: Secondary | ICD-10-CM

## 2017-07-26 DIAGNOSIS — R350 Frequency of micturition: Secondary | ICD-10-CM | POA: Diagnosis not present

## 2017-07-26 DIAGNOSIS — Z0001 Encounter for general adult medical examination with abnormal findings: Secondary | ICD-10-CM | POA: Diagnosis not present

## 2017-07-26 DIAGNOSIS — R030 Elevated blood-pressure reading, without diagnosis of hypertension: Secondary | ICD-10-CM

## 2017-07-26 DIAGNOSIS — E559 Vitamin D deficiency, unspecified: Secondary | ICD-10-CM | POA: Diagnosis not present

## 2017-07-26 DIAGNOSIS — Z6821 Body mass index (BMI) 21.0-21.9, adult: Secondary | ICD-10-CM

## 2017-07-26 LAB — CBC WITH DIFFERENTIAL/PLATELET
Basophils Absolute: 39 cells/uL (ref 0–200)
Basophils Relative: 0.6 %
Eosinophils Absolute: 91 cells/uL (ref 15–500)
Eosinophils Relative: 1.4 %
HEMATOCRIT: 42.3 % (ref 35.0–45.0)
HEMOGLOBIN: 14.6 g/dL (ref 11.7–15.5)
LYMPHS ABS: 1853 {cells}/uL (ref 850–3900)
MCH: 30.5 pg (ref 27.0–33.0)
MCHC: 34.5 g/dL (ref 32.0–36.0)
MCV: 88.5 fL (ref 80.0–100.0)
MPV: 8.9 fL (ref 7.5–12.5)
Monocytes Relative: 7.1 %
NEUTROS PCT: 62.4 %
Neutro Abs: 4056 cells/uL (ref 1500–7800)
Platelets: 246 10*3/uL (ref 140–400)
RBC: 4.78 10*6/uL (ref 3.80–5.10)
RDW: 12.4 % (ref 11.0–15.0)
Total Lymphocyte: 28.5 %
WBC: 6.5 10*3/uL (ref 3.8–10.8)
WBCMIX: 462 {cells}/uL (ref 200–950)

## 2017-07-26 LAB — COMPLETE METABOLIC PANEL WITH GFR
AG RATIO: 2.1 (calc) (ref 1.0–2.5)
ALKALINE PHOSPHATASE (APISO): 56 U/L (ref 33–130)
ALT: 11 U/L (ref 6–29)
AST: 16 U/L (ref 10–35)
Albumin: 4.7 g/dL (ref 3.6–5.1)
BUN: 11 mg/dL (ref 7–25)
CALCIUM: 10 mg/dL (ref 8.6–10.4)
CO2: 32 mmol/L (ref 20–32)
Chloride: 101 mmol/L (ref 98–110)
Creat: 0.82 mg/dL (ref 0.50–0.99)
GFR, EST NON AFRICAN AMERICAN: 74 mL/min/{1.73_m2} (ref >=60)
GFR, Est African American: 85 mL/min/{1.73_m2} (ref >=60)
GLOBULIN: 2.2 g/dL (ref 1.9–3.7)
Glucose, Bld: 86 mg/dL (ref 65–99)
Potassium: 4 mmol/L (ref 3.5–5.3)
SODIUM: 140 mmol/L (ref 135–146)
Total Bilirubin: 0.5 mg/dL (ref 0.2–1.2)
Total Protein: 6.9 g/dL (ref 6.1–8.1)

## 2017-07-26 LAB — LIPID PANEL
CHOL/HDL RATIO: 2.3 (calc) (ref <5.0)
CHOLESTEROL: 205 mg/dL — AB (ref <200)
HDL: 90 mg/dL (ref >50)
LDL Cholesterol (Calc): 94 mg/dL (calc)
Non-HDL Cholesterol (Calc): 115 mg/dL (calc) (ref <130)
Triglycerides: 117 mg/dL (ref <150)

## 2017-07-26 LAB — TSH: TSH: 2.83 mIU/L (ref 0.40–4.50)

## 2017-07-26 MED ORDER — HYDROCORTISONE 0.5 % EX OINT: 1.0000 "application " | TOPICAL_OINTMENT | Freq: Two times a day (BID) | CUTANEOUS | 0 refills | Status: DC

## 2017-07-26 MED ORDER — HYDROCORTISONE ACE-PRAMOXINE 2.5-1 % RE CREA
1.0000 | TOPICAL_CREAM | Freq: Three times a day (TID) | RECTAL | 2 refills | Status: DC
Start: 2017-07-26 — End: 2017-07-26

## 2017-07-26 MED ORDER — LIDOCAINE HCL URETHRAL/MUCOSAL 2 % EX GEL: CUTANEOUS | 0 refills | Status: DC

## 2017-07-26 NOTE — Patient Instructions (Addendum)
Can do 1-2 teaspoons of apple cider vinegar in the AM straight or with hot tea/water   zantac 150-300 mg OR pepcid 20 or 40mg  AS NEEDED if the apple cider vinegar does not help   Avoid alcohol, spicy foods, NSAIDS (aleve, ibuprofen) at this time. See foods below.    Food Choices for Gastroesophageal Reflux Disease When you have gastroesophageal reflux disease (GERD), the foods you eat and your eating habits are very important. Choosing the right foods can help ease the discomfort of GERD. WHAT GENERAL GUIDELINES DO I NEED TO FOLLOW?  Choose fruits, vegetables, whole grains, low-fat dairy products, and low-fat meat, fish, and poultry.  Limit fats such as oils, salad dressings, butter, nuts, and avocado.  Keep a food diary to identify foods that cause symptoms.  Avoid foods that cause reflux. These may be different for different people.  Eat frequent small meals instead of three large meals each day.  Eat your meals slowly, in a relaxed setting.  Limit fried foods.  Cook foods using methods other than frying.  Avoid drinking alcohol.  Avoid drinking large amounts of liquids with your meals.  Avoid bending over or lying down until 2-3 hours after eating. WHAT FOODS ARE NOT RECOMMENDED? The following are some foods and drinks that may worsen your symptoms: Vegetables Tomatoes. Tomato juice. Tomato and spaghetti sauce. Chili peppers. Onion and garlic. Horseradish. Fruits Oranges, grapefruit, and lemon (fruit and juice). Meats High-fat meats, fish, and poultry. This includes hot dogs, ribs, ham, sausage, salami, and bacon. Dairy Whole milk and chocolate milk. Sour cream. Cream. Butter. Ice cream. Cream cheese.  Beverages Coffee and tea, with or without caffeine. Carbonated beverages or energy drinks. Condiments Hot sauce. Barbecue sauce.  Sweets/Desserts Chocolate and cocoa. Donuts. Peppermint and spearmint. Fats and Oils High-fat foods, including [place] fries and potato  chips. Other Vinegar. Strong spices, such as black pepper, white pepper, red pepper, cayenne, curry powder, cloves, ginger, and chili powder.   Anal Fissure, Adult An anal fissure is a small tear or crack in the skin around the anus. Bleeding from a fissure usually stops on its own within a few minutes. However, bleeding will often occur again with each bowel movement until the crack heals. CAUSES This condition may be caused by:  Passing large, hard stool (feces).  Frequent diarrhea.  Constipation.  Inflammatory bowel disease (Crohn disease or ulcerative colitis).  Infections.  Anal sex. SYMPTOMS Symptoms of this condition include:  Bleeding from the rectum.  Small amounts of blood seen on your stool, on toilet paper, or in the toilet after a bowel movement.  Painful bowel movements.  Itching, burning or irritation around the anus. DIAGNOSIS A health care provider may diagnose this condition by closely examining the anal area. An anal fissure can usually be seen with careful inspection. In some cases, a rectal exam may be performed, or a short tube (anoscope) may be used to examine the anal canal. TREATMENT Treatment for this condition may include:  Taking steps to avoid constipation. This may include making changes to your diet, such as increasing your intake of fiber or fluid.  Taking fiber supplements. These supplements can soften your stool to help make bowel movements easier. Your health care provider may also prescribe a stool softener if your stool is often hard.  Taking sitz baths. This may help to heal the tear.  Using medicated creams or ointments. These may be prescribed to lessen discomfort. HOME CARE INSTRUCTIONS Eating and Drinking  Avoid foods  that may be constipating, such as bananas and dairy products.  Drink enough fluid to keep your urine clear or pale yellow.  Maintain a diet that is high in fruits, whole grains, and vegetables. General  Instructions  Keep the anal area as clean and dry as possible.  Take sitz baths as told by your health care provider. Do not use soap in the sitz baths.  Take over-the-counter and prescription medicines only as told by your health care provider.  Use creams or ointments only as told by your health care provider.  Keep all follow-up visits as told by your health care provider. This is important. SEEK MEDICAL CARE IF:  You have more bleeding.  You have a fever.  You have diarrhea that is mixed with blood.  You continue to have pain.  Your problem is getting worse rather than better.   This information is not intended to replace advice given to you by your health care provider. Make sure you discuss any questions you have with your health care provider.   Document Released: 03/02/2005 Document Revised: 11/21/2014 Document Reviewed: 05/28/2014 Elsevier Interactive Patient Education [employer].

## 2017-07-28 LAB — URINE CULTURE
MICRO NUMBER: 90585250
SPECIMEN QUALITY: ADEQUATE

## 2017-07-28 LAB — URINALYSIS, ROUTINE W REFLEX MICROSCOPIC
BACTERIA UA: NONE SEEN /HPF
Bilirubin Urine: NEGATIVE
Glucose, UA: NEGATIVE
HGB URINE DIPSTICK: NEGATIVE
HYALINE CAST: NONE SEEN /LPF
Ketones, ur: NEGATIVE
Nitrite: NEGATIVE
PH: 7.5 (ref 5.0–8.0)
PROTEIN: NEGATIVE
RBC / HPF: NONE SEEN /HPF (ref 0–2)
Specific Gravity, Urine: 1.01 (ref 1.001–1.03)
Squamous Epithelial / LPF: NONE SEEN /HPF (ref <=5)
WBC UA: NONE SEEN /HPF (ref 0–5)

## 2017-08-03 DIAGNOSIS — M79622 Pain in left upper arm: Secondary | ICD-10-CM | POA: Diagnosis not present

## 2017-08-03 DIAGNOSIS — M79621 Pain in right upper arm: Secondary | ICD-10-CM | POA: Diagnosis not present

## 2017-08-03 DIAGNOSIS — R922 Inconclusive mammogram: Secondary | ICD-10-CM | POA: Diagnosis not present

## 2017-08-03 LAB — HM MAMMOGRAPHY

## 2017-08-13 ENCOUNTER — Encounter: Payer: Self-pay | Admitting: *Deleted

## 2017-09-02 DIAGNOSIS — Z01419 Encounter for gynecological examination (general) (routine) without abnormal findings: Secondary | ICD-10-CM | POA: Diagnosis not present

## 2017-10-05 ENCOUNTER — Other Ambulatory Visit: Payer: Self-pay | Admitting: Physician Assistant

## 2017-10-25 DIAGNOSIS — K635 Polyp of colon: Secondary | ICD-10-CM | POA: Diagnosis not present

## 2017-10-25 DIAGNOSIS — D126 Benign neoplasm of colon, unspecified: Secondary | ICD-10-CM | POA: Diagnosis not present

## 2017-10-25 DIAGNOSIS — K625 Hemorrhage of anus and rectum: Secondary | ICD-10-CM | POA: Diagnosis not present

## 2017-10-25 DIAGNOSIS — K6289 Other specified diseases of anus and rectum: Secondary | ICD-10-CM | POA: Diagnosis not present

## 2017-10-25 LAB — HM COLONOSCOPY

## 2017-10-27 DIAGNOSIS — K635 Polyp of colon: Secondary | ICD-10-CM | POA: Diagnosis not present

## 2017-10-27 DIAGNOSIS — D126 Benign neoplasm of colon, unspecified: Secondary | ICD-10-CM | POA: Diagnosis not present

## 2017-11-18 ENCOUNTER — Encounter: Payer: Self-pay | Admitting: Internal Medicine

## 2018-03-13 ENCOUNTER — Encounter: Payer: Self-pay | Admitting: Internal Medicine

## 2018-03-13 NOTE — Progress Notes (Signed)
[facility] Sheila Gray, M.D.     [name]. [name], P.A.-C [name], [name] [address] [place] [address] Telephone [phone] Telefax [phone]  Comprehensive Evaluation &  Examination     This very nice 67 y.o. MWF presents for a comprehensive evaluation and management of multiple medical co-morbidities.  Patient has been followed expectantly for labile or elevated for BP,  Abnormal lipids, Glucose Intolerance and Vitamin D Deficiency. Patient has hx/o age related Osteoporosis and is followed annually By Dr Cruzita Lederer.       Patient is expectantly monitored for labile or elevated BP.  Patient's BP has been controlled at home and patient denies any cardiac symptoms as chest pain, palpitations, shortness of breath, dizziness or ankle swelling. Today's BP is at goal - 116/72.      Patient's lipids have been controlled with diet. Last lipids were at goal with a high HDL of 90: Lab Results  Component Value Date   CHOL 205 (H) 07/26/2017   HDL 90 07/26/2017   LDLCALC 94 07/26/2017   TRIG 117 07/26/2017   CHOLHDL 2.3 07/26/2017      Patient is monitored for glucose intolerance and patient denies reactive hypoglycemic symptoms, visual blurring, diabetic polys or paresthesias. Last A1c was normal & at goal:  Lab Results  Component Value Date   HGBA1C 5.1 01/25/2017      Finally, patient has history of Vitamin D Deficiency and last Vitamin D was at goal: Lab Results  Component Value Date   VD25OH 43 01/25/2017   Current Outpatient Medications on File Prior to Visit  Medication Sig  . acetaminophen (TYLENOL) 500 MG tablet Take 500 mg as needed by mouth.  Marland Kitchen aspirin EC 81 MG tablet Take 81 mg by mouth daily.  . Calcium Citrate-Vitamin D (CALCIUM CITRATE + D3 PO) Take 4 tablets daily by mouth.   . Cholecalciferol (VITAMIN D PO) Take 4,000 Units by mouth daily.  . fexofenadine (ALLEGRA) 180  MG tablet Take 180 mg by mouth daily.  . hydrocortisone ointment 0.5 % APPLY TO AFFECTED AREA TWICE A DAY  . metroNIDAZOLE (METROCREAM) 0.75 % cream APPLY TO FACE AT BEDTIME  . Multiple Vitamin (MULTIVITAMIN) tablet Take 1 tablet by mouth daily.   No current facility-administered medications on file prior to visit.    Allergies  Allergen Reactions  . Amoxicillin Rash   Past Medical History:  Diagnosis Date  . Allergy   . Osteoporosis   . Vitamin D deficiency    Health Maintenance  Topic Date Due  . INFLUENZA VACCINE  10/14/2017  . MAMMOGRAM  08/04/2018  . COLONOSCOPY  10/25/2020  . TETANUS/TDAP  12/11/2025  . DEXA SCAN  Completed  . Hepatitis C Screening  Completed  . PNA vac Low Risk Adult  Completed   Immunization History  Administered Date(s) Administered  . Influenza-Unspecified 12/09/2015, 12/01/2017  . PPD Test 11/09/2013  . Pneumococcal Conjugate-13 03/29/2014  . Pneumococcal Polysaccharide-23 01/09/2016  . Td 05/14/2005, 12/12/2015  . Zoster 11/28/2009   Last Colon - 10/25/2017 - RV Buccini - recc f/u Flex sig in 1 year & Colonoscopy in 3 years - due Aug 2022  Last MGM & U/S  - 08/03/2017 - Negative  History reviewed. No pertinent surgical history.   Family History  Problem Relation Age of Onset  . Hypertension Mother   . Kidney disease Father   . Heart disease Father    Social History  Tobacco Use  . Smoking status: Never Smoker  . Smokeless tobacco: Never Used  Substance Use Topics  . Alcohol use: Yes    Alcohol/week: 3.0 standard drinks    Types: 3 drink(s) per week  . Drug use: No    ROS Constitutional: Denies fever, chills, weight loss/gain, headaches, insomnia,  night sweats, and change in appetite. Does c/o fatigue. Eyes: Denies redness, blurred vision, diplopia, discharge, itchy, watery eyes.  ENT: Denies discharge, congestion, post nasal drip, epistaxis, sore throat, earache, hearing loss, dental pain, Tinnitus, Vertigo, Sinus pain,  snoring.  Cardio: Denies chest pain, palpitations, irregular heartbeat, syncope, dyspnea, diaphoresis, orthopnea, PND, claudication, edema Respiratory: denies cough, dyspnea, DOE, pleurisy, hoarseness, laryngitis, wheezing.  Gastrointestinal: Denies dysphagia, heartburn, reflux, water brash, pain, cramps, nausea, vomiting, bloating, diarrhea, constipation, hematemesis, melena, hematochezia, jaundice, hemorrhoids Genitourinary: Denies dysuria, frequency, urgency, nocturia, hesitancy, discharge, hematuria, flank pain Breast: Breast lumps, nipple discharge, bleeding.  Musculoskeletal: Denies arthralgia, myalgia, stiffness, Jt. Swelling, pain, limp, and strain/sprain. Denies falls. Skin: Denies puritis, rash, hives, warts, acne, eczema, changing in skin lesion Neuro: No weakness, tremor, incoordination, spasms, paresthesia, pain Psychiatric: Denies confusion, memory loss, sensory loss. Denies Depression. Endocrine: Denies change in weight, skin, hair change, nocturia, and paresthesia, diabetic polys, visual blurring, hyper / hypo glycemic episodes.  Heme/Lymph: No excessive bleeding, bruising, enlarged lymph nodes.  Physical Exam  BP 116/72   Pulse 72   Temp (!) 97.2 F (36.2 C)   Resp 16   Ht 5' 1.5" (1.562 m)   Wt 118 lb 3.2 oz (53.6 kg)   BMI 21.97 kg/m   General Appearance: Well nourished, well groomed and in no apparent distress.  Eyes: PERRLA, EOMs, conjunctiva no swelling or erythema, normal fundi and vessels. Sinuses: No frontal/maxillary tenderness ENT/Mouth: EACs patent / TMs  nl. Nares clear without erythema, swelling, mucoid exudates. Oral hygiene is good. No erythema, swelling, or exudate. Tongue normal, non-obstructing. Tonsils not swollen or erythematous. Hearing normal.  Neck: Supple, thyroid not palpable. No bruits, nodes or JVD. Respiratory: Respiratory effort normal.  BS equal and clear bilateral without rales, rhonci, wheezing or stridor. Cardio: Heart sounds are  normal with regular rate and rhythm and no murmurs, rubs or gallops. Peripheral pulses are normal and equal bilaterally without edema. No aortic or femoral bruits. Chest: symmetric with normal excursions and percussion. Breasts: Symmetric, without lumps, nipple discharge, retractions, or fibrocystic changes.  Abdomen: Flat, soft with bowel sounds active. Nontender, no guarding, rebound, hernias, masses, or organomegaly.  Lymphatics: Non tender without lymphadenopathy.  Musculoskeletal: Full ROM all peripheral extremities, joint stability, 5/5 strength, and normal gait. Skin: Warm and dry without rashes, lesions, cyanosis, clubbing or  ecchymosis.  Neuro: Cranial nerves intact, reflexes equal bilaterally. Normal muscle tone, no cerebellar symptoms. Sensation intact.  Pysch: Alert and oriented X 3, normal affect, Insight and Judgment appropriate.   Assessment and Plan  1. Elevated BP without diagnosis of hypertension  - EKG 12-Lead - Urinalysis, Routine w reflex microscopic - Microalbumin / creatinine urine ratio - COMPLETE METABOLIC PANEL WITH GFR - Magnesium - CBC with Differential/Platelet - TSH  2. Hyperlipidemia, mixed  - EKG 12-Lead - Lipid panel - TSH  3. Abnormal glucose  - EKG 12-Lead - Hemoglobin A1c - Insulin, random  4. Vitamin D deficiency  - VITAMIN D 25 Hydroxyl  5. Age-related osteoporosis without current pathological fracture  6. Screening for ischemic heart disease  - EKG 12-Lead  7. FHx: heart disease  - EKG 12-Lead  8. Medication  management  - Urinalysis, Routine w reflex microscopic - Microalbumin / creatinine urine ratio - COMPLETE METABOLIC PANEL WITH GFR - Magnesium - Lipid panel - CBC with Differential/Platelet - TSH - Hemoglobin A1c - Insulin, random - VITAMIN D 25 Hydroxyl        Patient was counseled in prudent diet to achieve/maintain BMI less than 25 for weight control, BP monitoring, regular exercise and medications. Discussed  med's effects and SE's. Screening labs and tests as requested with regular follow-up as recommended. Over 40 minutes of exam, counseling, chart review and high complex critical decision making was performed.

## 2018-03-13 NOTE — Patient Instructions (Signed)

## 2018-03-14 ENCOUNTER — Ambulatory Visit (INDEPENDENT_AMBULATORY_CARE_PROVIDER_SITE_OTHER): Payer: Medicare Other | Admitting: Internal Medicine

## 2018-03-14 VITALS — BP 116/72 | HR 72 | Temp 97.2°F | Resp 16 | Ht 61.5 in | Wt 118.2 lb

## 2018-03-14 DIAGNOSIS — Z136 Encounter for screening for cardiovascular disorders: Secondary | ICD-10-CM | POA: Diagnosis not present

## 2018-03-14 DIAGNOSIS — R7309 Other abnormal glucose: Secondary | ICD-10-CM | POA: Diagnosis not present

## 2018-03-14 DIAGNOSIS — Z79899 Other long term (current) drug therapy: Secondary | ICD-10-CM

## 2018-03-14 DIAGNOSIS — Z8249 Family history of ischemic heart disease and other diseases of the circulatory system: Secondary | ICD-10-CM | POA: Diagnosis not present

## 2018-03-14 DIAGNOSIS — R030 Elevated blood-pressure reading, without diagnosis of hypertension: Secondary | ICD-10-CM

## 2018-03-14 DIAGNOSIS — M810 Age-related osteoporosis without current pathological fracture: Secondary | ICD-10-CM | POA: Diagnosis not present

## 2018-03-14 DIAGNOSIS — E782 Mixed hyperlipidemia: Secondary | ICD-10-CM | POA: Diagnosis not present

## 2018-03-14 DIAGNOSIS — E559 Vitamin D deficiency, unspecified: Secondary | ICD-10-CM

## 2018-03-15 LAB — LIPID PANEL
Cholesterol: 212 mg/dL — ABNORMAL HIGH (ref <200)
HDL: 90 mg/dL (ref >50)
LDL Cholesterol (Calc): 101 mg/dL (calc) — ABNORMAL HIGH
Non-HDL Cholesterol (Calc): 122 mg/dL (calc) (ref <130)
Total CHOL/HDL Ratio: 2.4 (calc) (ref <5.0)
Triglycerides: 116 mg/dL (ref <150)

## 2018-03-15 LAB — MICROALBUMIN / CREATININE URINE RATIO
CREATININE, URINE: 68 mg/dL (ref 20–275)
Microalb Creat Ratio: 3 mcg/mg creat (ref <30)
Microalb, Ur: 0.2 mg/dL

## 2018-03-15 LAB — CBC WITH DIFFERENTIAL/PLATELET
Absolute Monocytes: 547 cells/uL (ref 200–950)
Basophils Absolute: 29 cells/uL (ref 0–200)
Basophils Relative: 0.4 %
Eosinophils Absolute: 151 cells/uL (ref 15–500)
Eosinophils Relative: 2.1 %
HCT: 41.5 % (ref 35.0–45.0)
HEMOGLOBIN: 13.9 g/dL (ref 11.7–15.5)
Lymphs Abs: 2052 cells/uL (ref 850–3900)
MCH: 30.6 pg (ref 27.0–33.0)
MCHC: 33.5 g/dL (ref 32.0–36.0)
MCV: 91.4 fL (ref 80.0–100.0)
MPV: 9.2 fL (ref 7.5–12.5)
Monocytes Relative: 7.6 %
NEUTROS ABS: 4421 {cells}/uL (ref 1500–7800)
Neutrophils Relative %: 61.4 %
Platelets: 239 10*3/uL (ref 140–400)
RBC: 4.54 10*6/uL (ref 3.80–5.10)
RDW: 13 % (ref 11.0–15.0)
Total Lymphocyte: 28.5 %
WBC: 7.2 10*3/uL (ref 3.8–10.8)

## 2018-03-15 LAB — COMPLETE METABOLIC PANEL WITH GFR
AG Ratio: 1.9 (calc) (ref 1.0–2.5)
ALBUMIN MSPROF: 4.7 g/dL (ref 3.6–5.1)
ALKALINE PHOSPHATASE (APISO): 59 U/L (ref 33–130)
ALT: 13 U/L (ref 6–29)
AST: 14 U/L (ref 10–35)
BUN: 20 mg/dL (ref 7–25)
CALCIUM: 9.9 mg/dL (ref 8.6–10.4)
CO2: 30 mmol/L (ref 20–32)
CREATININE: 0.92 mg/dL (ref 0.50–0.99)
Chloride: 101 mmol/L (ref 98–110)
GFR, EST NON AFRICAN AMERICAN: 64 mL/min/{1.73_m2} (ref >=60)
GFR, Est African American: 74 mL/min/{1.73_m2} (ref >=60)
GLOBULIN: 2.5 g/dL (ref 1.9–3.7)
Glucose, Bld: 76 mg/dL (ref 65–99)
Potassium: 4.1 mmol/L (ref 3.5–5.3)
SODIUM: 140 mmol/L (ref 135–146)
Total Bilirubin: 0.5 mg/dL (ref 0.2–1.2)
Total Protein: 7.2 g/dL (ref 6.1–8.1)

## 2018-03-15 LAB — HEMOGLOBIN A1C
Hgb A1c MFr Bld: 5.1 % of total Hgb (ref <5.7)
Mean Plasma Glucose: 100 (calc)
eAG (mmol/L): 5.5 (calc)

## 2018-03-15 LAB — TSH: TSH: 3.78 mIU/L (ref 0.40–4.50)

## 2018-03-15 LAB — URINALYSIS, ROUTINE W REFLEX MICROSCOPIC
Bilirubin Urine: NEGATIVE
Glucose, UA: NEGATIVE
HGB URINE DIPSTICK: NEGATIVE
KETONES UR: NEGATIVE
Leukocytes, UA: NEGATIVE
Nitrite: NEGATIVE
Protein, ur: NEGATIVE
SPECIFIC GRAVITY, URINE: 1.016 (ref 1.001–1.03)
pH: 7 (ref 5.0–8.0)

## 2018-03-15 LAB — VITAMIN D 25 HYDROXY (VIT D DEFICIENCY, FRACTURES): Vit D, 25-Hydroxy: 76 ng/mL (ref 30–100)

## 2018-03-15 LAB — INSULIN, RANDOM: INSULIN: 4 u[IU]/mL (ref 2.0–19.6)

## 2018-03-15 LAB — MAGNESIUM: Magnesium: 2 mg/dL (ref 1.5–2.5)

## 2018-03-18 ENCOUNTER — Telehealth: Payer: Self-pay | Admitting: Internal Medicine

## 2018-03-18 NOTE — Telephone Encounter (Signed)
Patient stated that Dr Cruzita Lederer mentioned that the patient needed a bone density done before her next visit. She would like someone to help her get this scheduled.  Please advise

## 2018-03-21 ENCOUNTER — Other Ambulatory Visit: Payer: Self-pay | Admitting: Internal Medicine

## 2018-03-21 DIAGNOSIS — M810 Age-related osteoporosis without current pathological fracture: Secondary | ICD-10-CM

## 2018-03-21 NOTE — Telephone Encounter (Signed)
Attempted to schedule patient for this but was told order has not been placed please advise

## 2018-03-21 NOTE — Telephone Encounter (Signed)
I ordered it at [facility] (I had to enter the center as External... Can you plz try to schedule this for her? - I am not sure if insurance will cover it before 10/2018, but we can try

## 2018-04-01 DIAGNOSIS — M810 Age-related osteoporosis without current pathological fracture: Secondary | ICD-10-CM | POA: Diagnosis not present

## 2018-04-01 LAB — HM DEXA SCAN

## 2018-04-13 ENCOUNTER — Encounter: Payer: Self-pay | Admitting: Internal Medicine

## 2018-04-13 ENCOUNTER — Ambulatory Visit (INDEPENDENT_AMBULATORY_CARE_PROVIDER_SITE_OTHER): Payer: Medicare Other | Admitting: Internal Medicine

## 2018-04-13 VITALS — BP 120/70 | HR 72 | Ht 61.5 in | Wt 119.0 lb

## 2018-04-13 DIAGNOSIS — M810 Age-related osteoporosis without current pathological fracture: Secondary | ICD-10-CM

## 2018-04-13 DIAGNOSIS — E559 Vitamin D deficiency, unspecified: Secondary | ICD-10-CM | POA: Diagnosis not present

## 2018-04-13 NOTE — Patient Instructions (Signed)
Please come back for a follow-up appointment in 1 year.  

## 2018-04-13 NOTE — Progress Notes (Signed)
Patient ID: Sheila Gray, female   DOB: Sep 09, 1950, 68 y.o.   MRN: [id number]    HPI  Sheila Gray is a 68 y.o.-year-old female, returning for follow-up for osteoporosis.  She also has a history of vitamin D deficiency.  Last visit a year ago.  Pt was dx with OP in 2016. She had a ruptured disk in 2016 >> less active. In 2018, T scores were worse.  Since then, she started to exercise and be less sedentary.  Reviewed previous DXA scans: Date L1-L4 T score FN T score 33% distal Radius  04/01/2018 ([facility])  -2.20 (-2.9%*) RFN: -2.8 LFN: -3.0 n/a  11/12/2016 ([facility])  -2.0 (-6.3%*) RFN: -3.0 LFN: -3.4 n/a  11/09/2014 ([facility])  -1.6 RFN: -2.3 LFN: -2.6 n/a   She denies any falls or fractures since last visit.  No dizziness, vertigo, orthostasis, poor vision.   Reviewed previous osteoporotic treatments: - Actonel 1990s - Fosamax 1990s  Took bisphosphonates for 6 years. >> stopped when aunt developed ONJ (she had metastatic cancer) - Calcitonin ~2 years in 2014, then ~1.5 years 2 years ago.  She started [facility] 03/2017 >> until 02/2018, when her mother got sick.  She plans to restart.  She cut out a lot of dairy, eggs. Now drinks almond milk.  She has a distant history of vitamin D deficiency, recent levels normal: Lab Results  Component Value Date   VD25OH 76 03/14/2018   VD25OH 65 01/25/2017   VD25OH 54 12/12/2015   VD25OH 71 06/19/2015   VD25OH 60 11/13/2014   VD25OH 60 11/09/2013   Pt is on calcium citrate 500 mg daily + MVI; she is on vitamin D supplement 4000 units daily + MVI.  She is still walking 4 miles per weekend (she was busy with her mother being sick), which is started before last visit; also, going up stairs in her house; Qi gong.   She does not take high vitamin A doses.  Menopause was at 68 years old- was on Tamoxifen - prophylactically (!) in the 90s as she had extensive FH of BrCA.  Pt does have a FH of osteoporosis MGM, M aunt  - broke  hips (? OP in mother, but unclear)  No history of persistent hyper/hypocalcemia or hyperparathyroidism.  No history of kidney stones. Lab Results  Component Value Date   PTH 37 01/27/2017   CALCIUM 9.9 03/14/2018   CALCIUM 10.0 07/26/2017   CALCIUM 9.9 01/27/2017   CALCIUM 10.7 (H) 01/25/2017   CALCIUM 9.6 07/22/2016   CALCIUM 9.5 12/12/2015   CALCIUM 10.3 06/19/2015   CALCIUM 10.1 11/13/2014   CALCIUM 9.2 11/09/2013   CALCIUM 10.2 04/10/2013   No history of thyrotoxicosis.  Latest TSH levels normal: Lab Results  Component Value Date   TSH 3.78 03/14/2018   TSH 2.83 07/26/2017   TSH 2.91 01/25/2017   TSH 1.67 07/22/2016   TSH 2.85 12/12/2015   No history of CKD. Last BUN/Cr: Lab Results  Component Value Date   BUN 20 03/14/2018   CREATININE 0.92 03/14/2018    ROS: Constitutional: no weight gain/no weight loss, no fatigue, no subjective hyperthermia, no subjective hypothermia Eyes: no blurry vision, no xerophthalmia ENT: no sore throat, no nodules palpated in neck, no dysphagia, no odynophagia, no hoarseness Cardiovascular: no CP/no SOB/no palpitations/no leg swelling Respiratory: no cough/no SOB/no wheezing Gastrointestinal: no N/no V/no D/no C/no acid reflux Musculoskeletal: no muscle aches/no joint aches Skin: no rashes, no hair loss Neurological: no tremors/no numbness/no  tingling/no dizziness  I reviewed pt's medications, allergies, PMH, social hx, family hx, and changes were documented in the history of present illness. Otherwise, unchanged from my initial visit note.  Past Medical History:  Diagnosis Date  . Allergy   . Osteoporosis   . Vitamin D deficiency    Hyperlipidemia  C5-C6 stenosis  Rosacea  Lumbar ruptured disc 2016  No past surgical history on file. Social History   Socioeconomic History  . Marital status: Married    Spouse name: Not on file  . Number of children: 3  Social Needs  Occupational History  .  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 Smoking status: Never Smoker  . Smokeless tobacco: Never Used  Substance and Sexual Activity  . Alcohol use: Yes    Alcohol/week: 1.5 oz    Types: 3 drink(s) per week  . Drug use: No   Current Outpatient Medications on File Prior to Visit  Medication Sig Dispense Refill  . acetaminophen (TYLENOL) 500 MG tablet Take 500 mg as needed by mouth.    Marland Kitchen aspirin EC 81 MG tablet Take 81 mg by mouth daily.    . Calcium Citrate-Vitamin D (CALCIUM CITRATE + D3 PO) Take 4 tablets daily by mouth.     . Cholecalciferol (VITAMIN D PO) Take 4,000 Units by mouth daily.    . fexofenadine (ALLEGRA) 180 MG tablet Take 180 mg by mouth daily.    . hydrocortisone ointment 0.5 % APPLY TO AFFECTED AREA TWICE A DAY 28.35 g 0  . metroNIDAZOLE (METROCREAM) 0.75 % cream APPLY TO FACE AT BEDTIME  10  . Multiple Vitamin (MULTIVITAMIN) tablet Take 1 tablet by mouth daily.     No current facility-administered medications on file prior to visit.    Allergies  Allergen Reactions  . Amoxicillin Rash   Family History  Problem Relation Age of Onset  . Hypertension Mother   . Kidney disease Father   . Heart disease Father     PE: BP 120/70   Pulse 72   Ht 5' 1.5" (1.562 m)   Wt 119 lb (54 kg)   SpO2 97%   BMI 22.12 kg/m  Wt Readings from Last 3 Encounters:  04/13/18 119 lb (54 kg)  03/14/18 118 lb 3.2 oz (53.6 kg)  07/26/17 116 lb 6.4 oz (52.8 kg)   Constitutional: Thin, in NAD Eyes: PERRLA, EOMI, no exophthalmos ENT: moist mucous membranes, no thyromegaly, no cervical lymphadenopathy Cardiovascular: RRR, No MRG Respiratory: CTA B Gastrointestinal: abdomen soft, NT, ND, BS+ Musculoskeletal: no deformities, strength intact in all 4 Skin: moist, warm, no rashes Neurological: no tremor with outstretched hands, DTR normal in all 4  Assessment: 1. Osteoporosis  2.  History of vitamin D deficiency  Plan: 1. Osteoporosis -Likely age-related + postmenopausal.  She also has  family history of osteoporosis -We reviewed together her latest bone density scores from 2016, 2018, and 2020.  There is a discrepancy between the scores from 2016 and 2018 (steep decrease in T scores), most likely due to changing the DXA machine between these dates.  However, this could also be explained by the patient being relatively inactive after her ruptured intervertebral disc in 2016.  Since then, she did restart exercise: Walking but not much weightbearing.  She is also doing balance exercises and at last visit I gave her the NOF (National osteoporosis foundation) guidelines for exercise in patients with osteoporosis. At last visit I also recommended [facility] for skeletal  loading >> she started this and she likes it.  She has been doing this for 11 months.  She had to stop in December when her mother was sick.  Reviewing her 2020 T-scores, this had improved significantly at hips, but it was slightly lower (statistically significant) at the level of her spine. -She is very reticent to start medications as she is afraid of possible side effects but in the light of the recent improvement in her T-scores, we can probably follow her without medications for now. -We again discussed about proper calcium and vitamin D intake.  I recommended to make sure she gets 1000 to 1200 mg calcium daily preferentially from the diet.  She is on a vitamin D supplement, 4000 units daily + multivitamin and latest level was normal recently.  Will not repeat this today -We discussed about low diet, with which she is doing a good job.  She is not a smoker and does not drink more than 2 drinks of alcohol a day. -Again discussed fall precautions.  She did not have any falls since last visit. -We will repeat her bone density score in 2 years.  However, I will see the patient back in 1 year.  2.  History of vitamin D deficiency -She continues 4000 units vitamin D daily + multivitamin -At last visit, vitamin D level  was normal in 02/2018..  We will not repeat the level today.  - time spent with the patient: 25 min, of which >50% was spent in obtaining information about her symptoms, reviewing her previous labs, evaluations, and treatments, counseling her about her conditions (please see the discussed topics above), and developing a plan to further investigate and treat it.  Philemon Kingdom, MD PhD [facility]

## 2018-04-27 DIAGNOSIS — H6993 Unspecified Eustachian tube disorder, bilateral: Secondary | ICD-10-CM | POA: Diagnosis not present

## 2018-04-27 DIAGNOSIS — H6121 Impacted cerumen, right ear: Secondary | ICD-10-CM | POA: Diagnosis not present

## 2018-04-27 DIAGNOSIS — H938X2 Other specified disorders of left ear: Secondary | ICD-10-CM | POA: Diagnosis not present

## 2018-04-27 DIAGNOSIS — J3089 Other allergic rhinitis: Secondary | ICD-10-CM | POA: Diagnosis not present

## 2018-05-02 ENCOUNTER — Encounter: Payer: Self-pay | Admitting: *Deleted

## 2018-05-03 NOTE — Telephone Encounter (Signed)
Patient had this done on 04/01/18 I requested results to be faxed to [place]

## 2018-05-03 NOTE — Telephone Encounter (Signed)
Yes, we got them.

## 2018-05-23 ENCOUNTER — Other Ambulatory Visit: Payer: Self-pay | Admitting: Internal Medicine

## 2018-05-23 DIAGNOSIS — R05 Cough: Secondary | ICD-10-CM

## 2018-05-23 DIAGNOSIS — R059 Cough, unspecified: Secondary | ICD-10-CM

## 2018-05-23 DIAGNOSIS — Z79899 Other long term (current) drug therapy: Secondary | ICD-10-CM

## 2018-05-23 MED ORDER — AZITHROMYCIN 250 MG PO TABS: ORAL_TABLET | ORAL | 1 refills | Status: DC

## 2018-05-23 MED ORDER — PREDNISONE 20 MG PO TABS: ORAL_TABLET | ORAL | 0 refills | Status: DC

## 2018-05-24 ENCOUNTER — Ambulatory Visit
Admission: RE | Admit: 2018-05-24 | Discharge: 2018-05-24 | Disposition: A | Discharge status: Home / Self Care | Payer: Medicare Other | Source: Ambulatory Visit | Attending: Internal Medicine | Admitting: Internal Medicine

## 2018-05-24 DIAGNOSIS — R05 Cough: Secondary | ICD-10-CM

## 2018-05-24 DIAGNOSIS — R059 Cough, unspecified: Secondary | ICD-10-CM

## 2018-05-28 ENCOUNTER — Other Ambulatory Visit: Payer: Self-pay | Admitting: Internal Medicine

## 2018-05-28 MED ORDER — HYDROXYCHLOROQUINE SULFATE 200 MG PO TABS: 200.0000 mg | ORAL_TABLET | Freq: Two times a day (BID) | ORAL | 2 refills | Status: DC

## 2018-05-28 MED ORDER — HYDROXYCHLOROQUINE SULFATE 200 MG PO TABS: ORAL_TABLET | ORAL | 1 refills | Status: DC

## 2018-06-03 ENCOUNTER — Other Ambulatory Visit: Payer: Self-pay | Admitting: Internal Medicine

## 2018-06-03 MED ORDER — AZITHROMYCIN 250 MG PO TABS: ORAL_TABLET | ORAL | 1 refills | Status: DC

## 2018-08-09 NOTE — Progress Notes (Signed)
MEDICARE ANNUAL WELLNESS VISIT AND CPE  Assessment:    Seasonal allergic rhinitis due to pollen Continue OTC allergy pills  Osteoporosis, unspecified osteoporosis type, unspecified pathological fracture presence -    Continue follow up  Vitamin D deficiency Continue supplement  Elevated BP without diagnosis of hypertension - continue medications, DASH diet, exercise and monitor at home. Call if greater than 130/80.  -     CBC with Differential/Platelet -     BASIC METABOLIC PANEL WITH GFR -     Hepatic function panel -     TSH  Hyperlipidemia, unspecified hyperlipidemia type -continue medications, check lipids, decrease fatty foods, increase activity.  -     Lipid panel  Medication management -     Magnesium  Encounter for Medicare annual wellness exam 1 year  Over 30 minutes of exam, counseling, chart review and critical decision making was performed Future Appointments  Date Time Provider [place]  03/31/2019  9:00 AM Unk Pinto, MD [facility] None  04/14/2019 10:00 AM Philemon Kingdom, MD [facility] None     Plan:   During the course of the visit the patient was educated and counseled about appropriate screening and preventive services including:    Pneumococcal vaccine   Prevnar 13  Influenza vaccine  Td vaccine  Screening electrocardiogram  Bone densitometry screening  Colorectal cancer screening  Diabetes screening  Glaucoma screening  Nutrition counseling   Advanced directives: requested   Subjective:  Sheila Gray is a 68 y.o. female who presents for Medicare Annual Wellness Visit and complete physical.    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   Her blood pressure has been controlled at home, today their BP is   She does workout. She denies chest pain, shortness of breath, dizziness.   She is seeing Dr. Darnell Level for osteoporosis, doing osteostrong and exercising.   BMI is Body mass  index is 21.56 kg/m., she is working on diet and exercise. Wt Readings from Last 3 Encounters:  08/10/18 116 lb (52.6 kg)  04/13/18 119 lb (54 kg)  03/14/18 118 lb 3.2 oz (53.6 kg)   She is on cholesterol medication and denies myalgias. Her cholesterol is at goal. The cholesterol last visit was:   Lab Results  Component Value Date   CHOL 212 (H) 03/14/2018   HDL 90 03/14/2018   LDLCALC 101 (H) 03/14/2018   TRIG 116 03/14/2018   CHOLHDL 2.4 03/14/2018   Last A1C Lab Results  Component Value Date   HGBA1C 5.1 03/14/2018   Last GFR: Lab Results  Component Value Date   GFRNONAA 64 03/14/2018   Patient is on Vitamin D supplement.   Lab Results  Component Value Date   VD25OH 76 03/14/2018      Medication Review: Current Outpatient Medications on File Prior to Visit  Medication Sig Dispense Refill  . acetaminophen (TYLENOL) 500 MG tablet Take 500 mg as needed by mouth.    Marland Kitchen aspirin EC 81 MG tablet Take 81 mg by mouth daily.    . Calcium Citrate-Vitamin D (CALCIUM CITRATE + D3 PO) Take 4 tablets daily by mouth.     . Cholecalciferol (VITAMIN D PO) Take 4,000 Units by mouth daily.    . Famotidine (PEPCID PO) Take by mouth.    . fexofenadine (ALLEGRA) 180 MG tablet Take 180 mg by mouth daily.    . metroNIDAZOLE (METROCREAM) 0.75 % cream APPLY TO FACE AT BEDTIME  10  . Multiple Vitamin (MULTIVITAMIN) tablet Take 1 tablet by mouth daily.    Marland Kitchen OVER THE COUNTER MEDICATION     . Probiotic Product (PROBIOTIC PO) Take by mouth.    . Zinc 25 MG TABS Take by mouth.     No current facility-administered medications on file prior to visit.     Allergies  Allergen Reactions  . Amoxicillin Rash    Current Problems (verified) Patient Active Problem List   Diagnosis Date Noted  . Elevated BP without diagnosis of hypertension 06/19/2015  . Hyperlipidemia 06/19/2015  . Medication management 06/19/2015  . Allergic rhinitis 04/10/2013  . Osteoporosis 04/10/2013  . Vitamin D  deficiency 04/10/2013    Screening Tests Immunization History  Administered Date(s) Administered  . Influenza,inj,Quad PF,6+ Mos 12/14/2016  . Influenza-Unspecified 11/29/2013, 12/09/2015, 12/01/2017  . PPD Test 11/09/2013  . Pneumococcal Conjugate-13 03/29/2014  . Pneumococcal Polysaccharide-23 01/09/2016  . Td 05/14/2005, 12/12/2015  . Zoster 11/28/2009    Preventative care: Last colonoscopy: 10/2017- Dr. Cristina Gong will do flex sig sometime this summer.  Last mammogram: 07/2017- will call after June 15th Last pap smear/pelvic exam: never abnormal pap  DEXA: 03/2018 following Dr. Darnell Level  Prior vaccinations: TD or Tdap: 2017 Influenza: 2019 Pneumococcal: 2017 Prevnar13: 2016 Shingles/Zostavax: 2011  Names of Other Physician/Practitioners you currently use: 1. [facility] here for primary care 2. Kluge, eye doctor, last visit 2020 3. Shanon Brow, dentist, last visit appt 2020 Patient Care Team: Unk Pinto, MD as PCP - General (Internal Medicine) Inda Castle, MD (Inactive) as Consulting Physician (Gastroenterology) Love, Alyson Locket, MD as Consulting Physician (Neurology) [place] Ander Slade, MD (Rheumatology) Lavonna Monarch, MD as Consulting Physician (Dermatology) Garald Balding, MD as Consulting Physician (Orthopedic Surgery) Gus Height, MD (Inactive) as Consulting Physician (Obstetrics and Gynecology)  SURGICAL HISTORY She  has no past surgical history on file. FAMILY HISTORY Her family history includes Heart disease in her father; Hypertension in her mother; Kidney disease in her father. SOCIAL HISTORY She  reports that she has never smoked. She has never used smokeless tobacco. She reports current alcohol use of about 3.0 standard drinks of alcohol per week. She reports that she does not use drugs.   MEDICARE WELLNESS OBJECTIVES: Physical activity: Current Exercise Habits: Home exercise routine, Type of exercise: walking, Time  (Minutes): 30, Frequency (Times/Week): 7, Weekly Exercise (Minutes/Week): 210 Cardiac risk factors: Cardiac Risk Factors include: advanced age (>54men, >3 women);dyslipidemia;hypertension Depression/mood screen:   Depression screen [facility] 2/9 08/10/2018  Decreased Interest 0  Down, Depressed, Hopeless 0  PHQ - 2 Score 0    ADLs:  In your present state of health, do you have any difficulty performing the following activities: 08/10/2018 03/13/2018  Hearing? N N  Vision? N N  Difficulty concentrating or making decisions? N N  Walking or climbing stairs? N N  Dressing or bathing? N N  Doing errands, shopping? N N  Some recent data might be hidden     Cognitive Testing  Alert? Yes  Normal Appearance?Yes  Oriented to person? Yes  Place? Yes   Time? Yes  Recall of three objects?  Yes  Can perform simple calculations? Yes  Displays appropriate judgment?Yes  Can read the correct time from a watch face?Yes  EOL planning: Does Patient Have a Medical Advance Directive?: Yes Type of Advance Directive: Healthcare Power of Attorney, Living will Copy of [place] in Chart?: No - copy requested  Review of Systems  Constitutional:  Negative.   HENT: Negative.   Eyes: Negative.   Respiratory: Positive for cough. Negative for hemoptysis, sputum production, shortness of breath and wheezing.   Cardiovascular: Negative.   Gastrointestinal: Negative.   Genitourinary: Negative.   Musculoskeletal: Negative.   Skin: Negative.   Neurological: Negative.   Endo/Heme/Allergies: Negative.   Psychiatric/Behavioral: Negative.      Objective:     Today's Vitals   08/10/18 1035  Pulse: 75  SpO2: 98%  Weight: 116 lb (52.6 kg)  Height: 5' 1.5" (1.562 m)   Body mass index is 21.56 kg/m.  General Appearance:Well sounding, in no apparent distress.  ENT/Mouth: No hoarseness, No cough for duration of visit.  Respiratory: completing full sentences without distress, without audible  wheeze Neuro: Awake and oriented X 3,  Psych:  Insight and Judgment appropriate.    Medicare Attestation I have personally reviewed: The patient's medical and social history Their use of alcohol, tobacco or illicit drugs Their current medications and supplements The patient's functional ability including ADLs,fall risks, home safety risks, cognitive, and hearing and visual impairment Diet and physical activities Evidence for depression or mood disorders  The patient's weight, height, BMI, and visual acuity have been recorded in the chart.  I have made referrals, counseling, and provided education to the patient based on review of the above and I have provided the patient with a written personalized care plan for preventive services.     Vicie Mutters, PA-C   08/10/2018

## 2018-08-10 ENCOUNTER — Ambulatory Visit: Payer: Medicare Other | Admitting: Physician Assistant

## 2018-08-10 ENCOUNTER — Other Ambulatory Visit: Payer: Self-pay

## 2018-08-10 ENCOUNTER — Encounter: Payer: Self-pay | Admitting: Physician Assistant

## 2018-08-10 VITALS — HR 75 | Ht 61.5 in | Wt 116.0 lb

## 2018-08-10 DIAGNOSIS — E785 Hyperlipidemia, unspecified: Secondary | ICD-10-CM

## 2018-08-10 DIAGNOSIS — J301 Allergic rhinitis due to pollen: Secondary | ICD-10-CM

## 2018-08-10 DIAGNOSIS — R030 Elevated blood-pressure reading, without diagnosis of hypertension: Secondary | ICD-10-CM

## 2018-08-10 DIAGNOSIS — Z0000 Encounter for general adult medical examination without abnormal findings: Secondary | ICD-10-CM

## 2018-08-10 DIAGNOSIS — M810 Age-related osteoporosis without current pathological fracture: Secondary | ICD-10-CM

## 2018-08-10 DIAGNOSIS — R6889 Other general symptoms and signs: Secondary | ICD-10-CM | POA: Diagnosis not present

## 2018-08-10 DIAGNOSIS — Z0001 Encounter for general adult medical examination with abnormal findings: Secondary | ICD-10-CM | POA: Diagnosis not present

## 2018-08-10 DIAGNOSIS — E559 Vitamin D deficiency, unspecified: Secondary | ICD-10-CM

## 2018-08-10 DIAGNOSIS — Z79899 Other long term (current) drug therapy: Secondary | ICD-10-CM

## 2018-10-13 DIAGNOSIS — Z1231 Encounter for screening mammogram for malignant neoplasm of breast: Secondary | ICD-10-CM | POA: Diagnosis not present

## 2018-10-13 DIAGNOSIS — Z803 Family history of malignant neoplasm of breast: Secondary | ICD-10-CM | POA: Diagnosis not present

## 2018-10-13 LAB — HM MAMMOGRAPHY

## 2018-11-01 LAB — HM SIGMOIDOSCOPY

## 2018-11-02 ENCOUNTER — Encounter: Payer: Self-pay | Admitting: Internal Medicine

## 2018-11-02 DIAGNOSIS — D129 Benign neoplasm of anus and anal canal: Secondary | ICD-10-CM | POA: Diagnosis not present

## 2018-11-02 DIAGNOSIS — Z86010 Personal history of colonic polyps: Secondary | ICD-10-CM | POA: Diagnosis not present

## 2018-11-04 DIAGNOSIS — D129 Benign neoplasm of anus and anal canal: Secondary | ICD-10-CM | POA: Diagnosis not present

## 2018-11-16 ENCOUNTER — Encounter: Payer: Self-pay | Admitting: Internal Medicine

## 2018-12-20 ENCOUNTER — Other Ambulatory Visit: Payer: Self-pay

## 2018-12-20 ENCOUNTER — Ambulatory Visit (INDEPENDENT_AMBULATORY_CARE_PROVIDER_SITE_OTHER): Payer: Medicare Other | Admitting: *Deleted

## 2018-12-20 DIAGNOSIS — Z23 Encounter for immunization: Secondary | ICD-10-CM

## 2019-01-09 ENCOUNTER — Encounter: Payer: Self-pay | Admitting: *Deleted

## 2019-02-04 DIAGNOSIS — Z119 Encounter for screening for infectious and parasitic diseases, unspecified: Secondary | ICD-10-CM | POA: Diagnosis not present

## 2019-03-31 ENCOUNTER — Encounter: Payer: Self-pay | Admitting: Internal Medicine

## 2019-04-14 ENCOUNTER — Ambulatory Visit: Payer: Medicare Other

## 2019-04-14 ENCOUNTER — Ambulatory Visit: Payer: Medicare Other | Admitting: Internal Medicine

## 2019-04-25 ENCOUNTER — Ambulatory Visit: Payer: Medicare Other

## 2019-05-30 ENCOUNTER — Encounter: Payer: Self-pay | Admitting: Internal Medicine

## 2019-05-30 NOTE — Progress Notes (Signed)
.   Comprehensive Evaluation &  Examination     This very nice 69 y.o.  MWF presents for a  comprehensive evaluation and management of multiple medical co-morbidities.  Patient has been followed expectantly for elevated BP, HLD, glucose intolerance and Vitamin D Deficiency.       Patient has hx/o Osteoporosis predating circa 1990's and is followed by Dr Benjiman Core.      Patient followed expectantly for labile or elevated for BP's which have been controlled and  today's BP is at goal - 112/62 . Patient denies any cardiac symptoms as chest pain, palpitations, shortness of breath, dizziness or ankle swelling.      Patient's hyperlipidemia is controlled with diet.  Last lipids were near goal with LDL 101 and very high HDL 90:  Lab Results  Component Value Date   CHOL 212 (H) 03/14/2018   HDL 90 03/14/2018   LDLCALC 101 (H) 03/14/2018   TRIG 116 03/14/2018   CHOLHDL 2.4 03/14/2018       Patient his monitored for abnormal glucose  and patient denies reactive hypoglycemic symptoms, visual blurring, diabetic polys or paresthesias. Last A1c was Normal & at goal:  Lab Results  Component Value Date   HGBA1C 5.1 03/14/2018       Finally, patient has history of Vitamin D Deficiency and last Vitamin D was at goal:  Lab Results  Component Value Date   VD25OH 76 03/14/2018    Current Outpatient Medications on File Prior to Visit  Medication Sig  . acetaminophen (TYLENOL) 500 MG tablet Take 500 mg as needed by mouth.  Marland Kitchen aspirin EC 81 MG tablet Take 81 mg by mouth daily.  . Calcium Citrate-Vitamin D (CALCIUM CITRATE + D3 PO) Take 4 tablets daily by mouth.   . Cholecalciferol (VITAMIN D PO) Take 4,000 Units by mouth daily.  . Famotidine (PEPCID PO) Take by mouth.  . fexofenadine (ALLEGRA) 180 MG tablet Take 180 mg by mouth daily.  . LUTEIN PO Take 1 capsule by mouth daily.  . metroNIDAZOLE (METROCREAM) 0.75 % cream APPLY TO FACE AT BEDTIME  . Multiple Vitamin (MULTIVITAMIN) tablet  Take 1 tablet by mouth daily.  Marland Kitchen OVER THE COUNTER MEDICATION   . Probiotic Product (PROBIOTIC PO) Take by mouth.  . Zinc 25 MG TABS Take by mouth.   No current facility-administered medications on file prior to visit.   Allergies  Allergen Reactions  . Amoxicillin Rash   Past Medical History:  Diagnosis Date  . Allergy   . Osteoporosis   . Vitamin D deficiency    Health Maintenance  Topic Date Due  . MAMMOGRAM  10/13/2019  . DEXA SCAN  04/01/2020  . COLONOSCOPY  10/25/2020  . TETANUS/TDAP  12/11/2025  . INFLUENZA VACCINE  Completed  . Hepatitis C Screening  Completed  . PNA vac Low Risk Adult  Completed   Immunization History  Administered Date(s) Administered  . Influenza, High Dose Seasonal PF 12/20/2018  . Influenza,inj,Quad PF,6+ Mos 12/14/2016  . Influenza-Unspecified 11/29/2013, 12/09/2015, 12/01/2017  . Moderna SARS-COVID-2 Vaccination 04/14/2019, 05/15/2019  . PPD Test 11/09/2013  . Pneumococcal Conjugate-13 03/29/2014  . Pneumococcal Polysaccharide-23 01/09/2016  . Td 05/14/2005, 12/12/2015  . Zoster 11/28/2009    Last Colon - 08.12.2019 - Dr Bucccimi  -  Adenoma. rectal polyp - recc 3 yr f/u due Aug 2022.   Sigmoidoscopy - 08/18.2020 - Dr Romilda Garret  Last MGM - 07.30.2020   History reviewed. No pertinent surgical history.   Family History  Problem Relation Age of Onset  . Hypertension Mother   . Kidney disease Father   . Heart disease Father    Social History   Tobacco Use  . Smoking status: Never Smoker  . Smokeless tobacco: Never Used  Substance Use Topics  . Alcohol use: Yes    Alcohol/week: 3.0 standard drinks    Types: 3 drink(s) per week  . Drug use: No    ROS Constitutional: Denies fever, chills, weight loss/gain, headaches, insomnia,  night sweats, and change in appetite. Does c/o fatigue. Eyes: Denies redness, blurred vision, diplopia, discharge, itchy, watery eyes.  ENT: Denies discharge, congestion, post nasal drip, epistaxis, sore  throat, earache, hearing loss, dental pain, Tinnitus, Vertigo, Sinus pain, snoring.  Cardio: Denies chest pain, palpitations, irregular heartbeat, syncope, dyspnea, diaphoresis, orthopnea, PND, claudication, edema Respiratory: denies cough, dyspnea, DOE, pleurisy, hoarseness, laryngitis, wheezing.  Gastrointestinal: Denies dysphagia, heartburn, reflux, water brash, pain, cramps, nausea, vomiting, bloating, diarrhea, constipation, hematemesis, melena, hematochezia, jaundice, hemorrhoids Genitourinary: Denies dysuria, frequency, urgency, nocturia, hesitancy, discharge, hematuria, flank pain Breast: Breast lumps, nipple discharge, bleeding.  Musculoskeletal: Denies arthralgia, myalgia, stiffness, Jt. Swelling, pain, limp, and strain/sprain. Denies falls. Skin: Denies puritis, rash, hives, warts, acne, eczema, changing in skin lesion Neuro: No weakness, tremor, incoordination, spasms, paresthesia, pain Psychiatric: Denies confusion, memory loss, sensory loss. Denies Depression. Endocrine: Denies change in weight, skin, hair change, nocturia, and paresthesia, diabetic polys, visual blurring, hyper / hypo glycemic episodes.  Heme/Lymph: No excessive bleeding, bruising, enlarged lymph nodes.  Physical Exam  BP 112/62   Pulse 60   Temp 97.6 F (36.4 C)   Resp 16   Ht 5' 1.5" (1.562 m)   Wt 121 lb (54.9 kg)   BMI 22.49 kg/m   General Appearance: Well nourished, well groomed and in no apparent distress.  Eyes: PERRLA, EOMs, conjunctiva no swelling or erythema, normal fundi and vessels. Sinuses: No frontal/maxillary tenderness ENT/Mouth: EACs patent / TMs  nl. Nares clear without erythema, swelling, mucoid exudates. Oral hygiene is good. No erythema, swelling, or exudate. Tongue normal, non-obstructing. Tonsils not swollen or erythematous. Hearing normal.  Neck: Supple, thyroid not palpable. No bruits, nodes or JVD. Respiratory: Respiratory effort normal.  BS equal and clear bilateral without  rales, rhonci, wheezing or stridor. Cardio: Heart sounds are normal with regular rate and rhythm and no murmurs, rubs or gallops. Peripheral pulses are normal and equal bilaterally without edema. No aortic or femoral bruits. Chest: symmetric with normal excursions and percussion. Breasts: Symmetric, without lumps, nipple discharge, retractions, or fibrocystic changes.  Abdomen: Flat, soft with bowel sounds active. Nontender, no guarding, rebound, hernias, masses, or organomegaly.  Lymphatics: Non tender without lymphadenopathy.  Genitourinary:  Musculoskeletal: Full ROM all peripheral extremities, joint stability, 5/5 strength, and normal gait. Skin: Warm and dry without rashes, lesions, cyanosis, clubbing or  ecchymosis.  Neuro: Cranial nerves intact, reflexes equal bilaterally. Normal muscle tone, no cerebellar symptoms. Sensation intact.  Pysch: Alert and oriented X 3, normal affect, Insight and Judgment appropriate.   Assessment and Plan  1. Elevated BP without diagnosis of hypertension  - EKG 12-Lead - Urinalysis, Routine w reflex microscopic - Microalbumin / creatinine urine ratio - CBC with Differential/Platelet - COMPLETE METABOLIC PANEL WITH GFR - Magnesium - TSH  2. Hyperlipidemia, mixed  - EKG 12-Lead - Lipid panel - TSH  3. Abnormal glucose  - EKG 12-Lead - Hemoglobin A1c - Insulin, random  4. Vitamin D deficiency  - VITAMIN D 25 Hydroxy  5. Age-related osteoporosis  without current pathological fracture  - COMPLETE METABOLIC PANEL WITH GFR  6. Screening for ischemic heart disease  - EKG 12-Lead  7. FH: heart disease  - EKG 12-Lead  8. Medication management  - Urinalysis, Routine w reflex microscopic - Microalbumin / creatinine urine ratio - CBC with Differential/Platelet - COMPLETE METABOLIC PANEL WITH GFR - Magnesium - Lipid panel - TSH - Hemoglobin A1c - Insulin, random - VITAMIN D 25 Hydroxy  9. Screening for colorectal cancer  - POC  Hemoccult Bld/Stl        Patient was counseled in prudent diet to maintain BMI less than 25, BP monitoring, regular exercise and medications. Discussed med's effects and SE's. Screening labs and tests as requested with regular follow-up as recommended. Over 40 minutes of exam, counseling, chart review and high complex critical decision making was performed.   Kirtland Bouchard, MD

## 2019-05-30 NOTE — Patient Instructions (Signed)

## 2019-05-31 ENCOUNTER — Other Ambulatory Visit: Payer: Self-pay

## 2019-05-31 ENCOUNTER — Ambulatory Visit (INDEPENDENT_AMBULATORY_CARE_PROVIDER_SITE_OTHER): Payer: Medicare Other | Admitting: Internal Medicine

## 2019-05-31 VITALS — BP 112/62 | HR 60 | Temp 97.6°F | Resp 16 | Ht 61.5 in | Wt 121.0 lb

## 2019-05-31 DIAGNOSIS — Z8249 Family history of ischemic heart disease and other diseases of the circulatory system: Secondary | ICD-10-CM | POA: Diagnosis not present

## 2019-05-31 DIAGNOSIS — Z1211 Encounter for screening for malignant neoplasm of colon: Secondary | ICD-10-CM

## 2019-05-31 DIAGNOSIS — E559 Vitamin D deficiency, unspecified: Secondary | ICD-10-CM | POA: Diagnosis not present

## 2019-05-31 DIAGNOSIS — E782 Mixed hyperlipidemia: Secondary | ICD-10-CM | POA: Diagnosis not present

## 2019-05-31 DIAGNOSIS — R030 Elevated blood-pressure reading, without diagnosis of hypertension: Secondary | ICD-10-CM

## 2019-05-31 DIAGNOSIS — Z136 Encounter for screening for cardiovascular disorders: Secondary | ICD-10-CM

## 2019-05-31 DIAGNOSIS — Z79899 Other long term (current) drug therapy: Secondary | ICD-10-CM | POA: Diagnosis not present

## 2019-05-31 DIAGNOSIS — R7309 Other abnormal glucose: Secondary | ICD-10-CM

## 2019-05-31 DIAGNOSIS — M810 Age-related osteoporosis without current pathological fracture: Secondary | ICD-10-CM

## 2019-06-01 LAB — COMPLETE METABOLIC PANEL WITHOUT GFR
AG Ratio: 1.9 (calc) (ref 1.0–2.5)
ALT: 17 U/L (ref 6–29)
AST: 21 U/L (ref 10–35)
Albumin: 4.5 g/dL (ref 3.6–5.1)
Alkaline phosphatase (APISO): 61 U/L (ref 37–153)
BUN: 17 mg/dL (ref 7–25)
CO2: 24 mmol/L (ref 20–32)
Calcium: 9.6 mg/dL (ref 8.6–10.4)
Chloride: 104 mmol/L (ref 98–110)
Creat: 0.79 mg/dL (ref 0.60–0.93)
GFR, Est African American: 88 mL/min/1.73m2
GFR, Est Non African American: 76 mL/min/1.73m2
Globulin: 2.4 g/dL (ref 1.9–3.7)
Glucose, Bld: 84 mg/dL (ref 65–99)
Potassium: 4.1 mmol/L (ref 3.5–5.3)
Sodium: 140 mmol/L (ref 135–146)
Total Bilirubin: 0.4 mg/dL (ref 0.2–1.2)
Total Protein: 6.9 g/dL (ref 6.1–8.1)

## 2019-06-01 LAB — URINALYSIS, ROUTINE W REFLEX MICROSCOPIC
Bilirubin Urine: NEGATIVE
Glucose, UA: NEGATIVE
Hgb urine dipstick: NEGATIVE
Ketones, ur: NEGATIVE
Leukocytes,Ua: NEGATIVE
Nitrite: NEGATIVE
Protein, ur: NEGATIVE
Specific Gravity, Urine: 1.008 (ref 1.001–1.03)
pH: 6 (ref 5.0–8.0)

## 2019-06-01 LAB — CBC WITH DIFFERENTIAL/PLATELET
Absolute Monocytes: 574 {cells}/uL (ref 200–950)
Basophils Absolute: 49 {cells}/uL (ref 0–200)
Basophils Relative: 0.6 %
Eosinophils Absolute: 148 {cells}/uL (ref 15–500)
Eosinophils Relative: 1.8 %
HCT: 41.8 % (ref 35.0–45.0)
Hemoglobin: 14.2 g/dL (ref 11.7–15.5)
Lymphs Abs: 2567 {cells}/uL (ref 850–3900)
MCH: 31 pg (ref 27.0–33.0)
MCHC: 34 g/dL (ref 32.0–36.0)
MCV: 91.3 fL (ref 80.0–100.0)
MPV: 9 fL (ref 7.5–12.5)
Monocytes Relative: 7 %
Neutro Abs: 4863 {cells}/uL (ref 1500–7800)
Neutrophils Relative %: 59.3 %
Platelets: 249 Thousand/uL (ref 140–400)
RBC: 4.58 Million/uL (ref 3.80–5.10)
RDW: 12.4 % (ref 11.0–15.0)
Total Lymphocyte: 31.3 %
WBC: 8.2 Thousand/uL (ref 3.8–10.8)

## 2019-06-01 LAB — TSH: TSH: 4.23 mIU/L (ref 0.40–4.50)

## 2019-06-01 LAB — INSULIN, RANDOM: Insulin: 4.9 u[IU]/mL

## 2019-06-01 LAB — LIPID PANEL
Cholesterol: 217 mg/dL — ABNORMAL HIGH (ref <200)
HDL: 89 mg/dL (ref >=50)
LDL Cholesterol (Calc): 103 mg/dL (calc) — ABNORMAL HIGH
Non-HDL Cholesterol (Calc): 128 mg/dL (calc) (ref <130)
Total CHOL/HDL Ratio: 2.4 (calc) (ref <5.0)
Triglycerides: 156 mg/dL — ABNORMAL HIGH (ref <150)

## 2019-06-01 LAB — HEMOGLOBIN A1C
Hgb A1c MFr Bld: 5 % of total Hgb (ref <5.7)
Mean Plasma Glucose: 97 (calc)
eAG (mmol/L): 5.4 (calc)

## 2019-06-01 LAB — MICROALBUMIN / CREATININE URINE RATIO
Creatinine, Urine: 19 mg/dL — ABNORMAL LOW (ref 20–275)
Microalb, Ur: <0.2 mg/dL

## 2019-06-01 LAB — VITAMIN D 25 HYDROXY (VIT D DEFICIENCY, FRACTURES): Vit D, 25-Hydroxy: 60 ng/mL (ref 30–100)

## 2019-06-01 LAB — MAGNESIUM: Magnesium: 2 mg/dL (ref 1.5–2.5)

## 2019-06-12 DIAGNOSIS — H2512 Age-related nuclear cataract, left eye: Secondary | ICD-10-CM | POA: Diagnosis not present

## 2019-06-12 DIAGNOSIS — H2511 Age-related nuclear cataract, right eye: Secondary | ICD-10-CM | POA: Diagnosis not present

## 2019-06-12 DIAGNOSIS — H43811 Vitreous degeneration, right eye: Secondary | ICD-10-CM | POA: Diagnosis not present

## 2019-06-12 DIAGNOSIS — H43391 Other vitreous opacities, right eye: Secondary | ICD-10-CM | POA: Diagnosis not present

## 2019-06-15 DIAGNOSIS — M8588 Other specified disorders of bone density and structure, other site: Secondary | ICD-10-CM | POA: Diagnosis not present

## 2019-06-15 DIAGNOSIS — M810 Age-related osteoporosis without current pathological fracture: Secondary | ICD-10-CM | POA: Diagnosis not present

## 2019-06-15 LAB — HM DEXA SCAN

## 2019-06-21 ENCOUNTER — Encounter: Payer: Self-pay | Admitting: *Deleted

## 2019-06-21 DIAGNOSIS — Z1159 Encounter for screening for other viral diseases: Secondary | ICD-10-CM | POA: Diagnosis not present

## 2019-06-26 DIAGNOSIS — K620 Anal polyp: Secondary | ICD-10-CM | POA: Diagnosis not present

## 2019-06-26 DIAGNOSIS — K635 Polyp of colon: Secondary | ICD-10-CM | POA: Diagnosis not present

## 2019-06-26 DIAGNOSIS — Z86010 Personal history of colonic polyps: Secondary | ICD-10-CM | POA: Diagnosis not present

## 2019-06-26 LAB — HM COLONOSCOPY

## 2019-06-29 DIAGNOSIS — K635 Polyp of colon: Secondary | ICD-10-CM | POA: Diagnosis not present

## 2019-06-30 ENCOUNTER — Encounter: Payer: Self-pay | Admitting: Internal Medicine

## 2019-07-25 ENCOUNTER — Encounter: Payer: Self-pay | Admitting: *Deleted

## 2019-08-10 ENCOUNTER — Ambulatory Visit: Payer: Medicare Other | Admitting: Physician Assistant

## 2019-08-16 ENCOUNTER — Ambulatory Visit: Payer: Medicare Other | Admitting: Physician Assistant

## 2019-09-04 ENCOUNTER — Ambulatory Visit: Payer: Medicare Other | Admitting: Physician Assistant

## 2019-09-12 NOTE — Progress Notes (Signed)
MEDICARE ANNUAL WELLNESS VISIT AND CPE  Assessment:    Seasonal allergic rhinitis due to pollen Continue OTC allergy pills Get on nasal spray  Osteoporosis, unspecified osteoporosis type, unspecified pathological fracture presence -    Continue follow up  Vitamin D deficiency Continue supplement  Elevated BP without diagnosis of hypertension - continue medications, DASH diet, exercise and monitor at home. Call if greater than 130/80.  -     CBC with Differential/Platelet -     BASIC METABOLIC PANEL WITH GFR -     Hepatic function panel -     TSH  Hyperlipidemia, unspecified hyperlipidemia type -continue medications, check lipids, decrease fatty foods, increase activity.  -     Lipid panel  Medication management -     Magnesium  Encounter for Medicare annual wellness exam 1 year  Over 30 minutes of exam, counseling, chart review and critical decision making was performed Future Appointments  Date Time Provider [place]  12/06/2019 11:30 AM Unk Pinto, MD [facility] None  06/10/2020 11:00 AM Unk Pinto, MD [facility] None     Plan:   During the course of the visit the patient was educated and counseled about appropriate screening and preventive services including:    Pneumococcal vaccine   Prevnar 13  Influenza vaccine  Td vaccine  Screening electrocardiogram  Bone densitometry screening  Colorectal cancer screening  Diabetes screening  Glaucoma screening  Nutrition counseling   Advanced directives: requested   Subjective:  Sheila Gray is a 69 y.o. female who presents for Medicare Annual Wellness Visit and complete physical.    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   Her blood pressure has been controlled at home, today their BP is BP: 110/72 She does workout. She denies chest pain, shortness of breath, dizziness.   She is seeing Dr. Darnell Level for osteoporosis, doing osteostrong and  exercising. Had DEXA 06/2019 and it was stable.   BMI is Body mass index is 22.31 kg/m., she is working on diet and exercise. Wt Readings from Last 3 Encounters:  09/13/19 122 lb (55.3 kg)  05/31/19 121 lb (54.9 kg)  08/10/18 116 lb (52.6 kg)   She is on cholesterol medication and denies myalgias. Her cholesterol is at goal. The cholesterol last visit was:   Lab Results  Component Value Date   CHOL 217 (H) 05/31/2019   HDL 89 05/31/2019   LDLCALC 103 (H) 05/31/2019   TRIG 156 (H) 05/31/2019   CHOLHDL 2.4 05/31/2019   Last A1C Lab Results  Component Value Date   HGBA1C 5.0 05/31/2019   Last GFR: Lab Results  Component Value Date   GFRNONAA 76 05/31/2019   Patient is on Vitamin D supplement.   Lab Results  Component Value Date   VD25OH 60 05/31/2019      Medication Review: Current Outpatient Medications on File Prior to Visit  Medication Sig Dispense Refill  . acetaminophen (TYLENOL) 500 MG tablet Take 500 mg as needed by mouth.    Marland Kitchen aspirin EC 81 MG tablet Take 81 mg by mouth daily.    . Calcium Citrate-Vitamin D (CALCIUM CITRATE + D3 PO) Take 4 tablets daily by mouth.     . Cholecalciferol (VITAMIN D PO) Take 4,000 Units by mouth daily.    . Famotidine (PEPCID PO) Take by mouth.    . fexofenadine (ALLEGRA) 180 MG tablet Take 180 mg by mouth daily.    . LUTEIN PO  Take 1 capsule by mouth daily.    . metroNIDAZOLE (METROCREAM) 0.75 % cream APPLY TO FACE AT BEDTIME  10  . Multiple Vitamin (MULTIVITAMIN) tablet Take 1 tablet by mouth daily.    Marland Kitchen OVER THE COUNTER MEDICATION     . Probiotic Product (PROBIOTIC PO) Take by mouth.    . Zinc 25 MG TABS Take by mouth.     No current facility-administered medications on file prior to visit.    Allergies  Allergen Reactions  . Amoxicillin Rash    Current Problems (verified) Patient Active Problem List   Diagnosis Date Noted  . Elevated BP without diagnosis of hypertension 06/19/2015  . Hyperlipidemia 06/19/2015  .  Medication management 06/19/2015  . Allergic rhinitis 04/10/2013  . Osteoporosis 04/10/2013  . Vitamin D deficiency 04/10/2013    Screening Tests Immunization History  Administered Date(s) Administered  . Influenza, High Dose Seasonal PF 12/20/2018  . Influenza,inj,Quad PF,6+ Mos 12/14/2016  . Influenza-Unspecified 11/29/2013, 12/09/2015, 12/01/2017  . Moderna SARS-COVID-2 Vaccination 04/14/2019, 05/15/2019  . PPD Test 11/09/2013  . Pneumococcal Conjugate-13 03/29/2014  . Pneumococcal Polysaccharide-23 01/09/2016  . Td 05/14/2005, 12/12/2015  . Zoster 11/28/2009   Health Maintenance  Topic Date Due  . MAMMOGRAM  10/13/2019  . INFLUENZA VACCINE  10/15/2019  . DEXA SCAN  06/14/2021  . COLONOSCOPY  06/25/2021  . TETANUS/TDAP  12/11/2025  . COVID-19 Vaccine  Completed  . Hepatitis C Screening  Completed  . PNA vac Low Risk Adult  Completed   Preventative care: Last colonoscopy: 10/2017- Dr. Cristina Gong due in 2023  Flex Sig 06/26/2019 getting one next time Last mammogram: 09/2018- has appointment in August Last pap smear/pelvic exam: never abnormal pap 2017 DEXA: 06/2019 following Dr. Darnell Level -3.2 did not get worse.   Names of Other Physician/Practitioners you currently use: 1. [facility] here for primary care 2. [place], eye doctor, last visit 2020 3. Shanon Brow, dentist, last visit appt 2020 Patient Care Team: Unk Pinto, MD as PCP - General (Internal Medicine) Inda Castle, MD (Inactive) as Consulting Physician (Gastroenterology) Love, Alyson Locket, MD as Consulting Physician (Neurology) [place] Ander Slade, MD (Rheumatology) Lavonna Monarch, MD as Consulting Physician (Dermatology) Garald Balding, MD as Consulting Physician (Orthopedic Surgery) Gus Height, MD (Inactive) as Consulting Physician (Obstetrics and Gynecology)  SURGICAL HISTORY She  has no past surgical history on file. FAMILY HISTORY Her family history includes Heart disease  in her father; Hypertension in her mother; Kidney disease in her father. SOCIAL HISTORY She  reports that she has never smoked. She has never used smokeless tobacco. She reports current alcohol use of about 3.0 standard drinks of alcohol per week. She reports that she does not use drugs.   MEDICARE WELLNESS OBJECTIVES: Physical activity:   Cardiac risk factors:   Depression/mood screen:   Depression screen [facility] 2/9 05/30/2019  Decreased Interest 0  Down, Depressed, Hopeless 0  PHQ - 2 Score 0    ADLs:  In your present state of health, do you have any difficulty performing the following activities: 05/30/2019  Hearing? N  Vision? N  Difficulty concentrating or making decisions? N  Walking or climbing stairs? N  Dressing or bathing? N  Doing errands, shopping? N  Some recent data might be hidden     Cognitive Testing  Alert? Yes  Normal Appearance?Yes  Oriented to person? Yes  Place? Yes   Time? Yes  Recall of three objects?  Yes  Can perform simple calculations? Yes  Displays appropriate judgment?Yes  Can read the correct time from a watch face?Yes  EOL planning: Does Patient Have a Medical Advance Directive?: Yes Type of Advance Directive: Healthcare Power of Attorney, Living will Copy of [place] in Chart?: No - copy requested  Review of Systems  Constitutional: Negative.   HENT: Negative.   Eyes: Negative.   Respiratory: Positive for cough. Negative for hemoptysis, sputum production, shortness of breath and wheezing.   Cardiovascular: Negative.   Gastrointestinal: Negative.   Genitourinary: Negative.   Musculoskeletal: Negative.   Skin: Negative.   Neurological: Negative.   Endo/Heme/Allergies: Negative.   Psychiatric/Behavioral: Negative.      Objective:     Today's Vitals   09/13/19 0901  BP: 110/72  Pulse: 75  Temp: (!) 97 F (36.1 C)  SpO2: 98%  Weight: 122 lb (55.3 kg)  Height: 5\' 2"  (1.575 m)  PainSc: 0-No pain   Body mass  index is 22.31 kg/m.  General appearance: alert, no distress, WD/WN, female HEENT: normocephalic, sclerae anicteric, TMs pearly, nares patent, no discharge or erythema, pharynx normal Oral cavity: MMM, no lesions Neck: supple, no lymphadenopathy, no thyromegaly, no masses Heart: RRR, normal S1, S2, no murmurs Lungs: CTA bilaterally, no wheezes, rhonchi, or rales Abdomen: +bs, soft, non tender, non distended, no masses, no hepatomegaly, no splenomegaly Musculoskeletal: nontender, no swelling, no obvious deformity Extremities: no edema, no cyanosis, no clubbing Pulses: 2+ symmetric, upper and lower extremities, normal cap refill Neurological: alert, oriented x 3, CN2-12 intact, strength normal upper extremities and lower extremities, sensation normal throughout, DTRs 2+ throughout, no cerebellar signs, gait normal Psychiatric: normal affect, behavior normal, pleasant    Medicare Attestation I have personally reviewed: The patient's medical and social history Their use of alcohol, tobacco or illicit drugs Their current medications and supplements The patient's functional ability including ADLs,fall risks, home safety risks, cognitive, and hearing and visual impairment Diet and physical activities Evidence for depression or mood disorders  The patient's weight, height, BMI, and visual acuity have been recorded in the chart.  I have made referrals, counseling, and provided education to the patient based on review of the above and I have provided the patient with a written personalized care plan for preventive services.     Vicie Mutters, PA-C   09/13/2019

## 2019-09-13 ENCOUNTER — Ambulatory Visit (INDEPENDENT_AMBULATORY_CARE_PROVIDER_SITE_OTHER): Payer: Medicare Other | Admitting: Physician Assistant

## 2019-09-13 ENCOUNTER — Encounter: Payer: Self-pay | Admitting: Physician Assistant

## 2019-09-13 ENCOUNTER — Other Ambulatory Visit: Payer: Self-pay

## 2019-09-13 VITALS — BP 110/72 | HR 75 | Temp 97.0°F | Ht 62.0 in | Wt 122.0 lb

## 2019-09-13 DIAGNOSIS — M810 Age-related osteoporosis without current pathological fracture: Secondary | ICD-10-CM

## 2019-09-13 DIAGNOSIS — R6889 Other general symptoms and signs: Secondary | ICD-10-CM

## 2019-09-13 DIAGNOSIS — J301 Allergic rhinitis due to pollen: Secondary | ICD-10-CM

## 2019-09-13 DIAGNOSIS — E559 Vitamin D deficiency, unspecified: Secondary | ICD-10-CM

## 2019-09-13 DIAGNOSIS — Z79899 Other long term (current) drug therapy: Secondary | ICD-10-CM | POA: Diagnosis not present

## 2019-09-13 DIAGNOSIS — R030 Elevated blood-pressure reading, without diagnosis of hypertension: Secondary | ICD-10-CM

## 2019-09-13 DIAGNOSIS — Z0001 Encounter for general adult medical examination with abnormal findings: Secondary | ICD-10-CM

## 2019-09-13 DIAGNOSIS — E785 Hyperlipidemia, unspecified: Secondary | ICD-10-CM

## 2019-09-13 DIAGNOSIS — Z0000 Encounter for general adult medical examination without abnormal findings: Secondary | ICD-10-CM

## 2019-09-13 NOTE — Patient Instructions (Addendum)
Use a dropper or use a cap to put peroxide, olive oil,mineral oil or canola oil in the effected ear- 2-3 times a week. Let it soak for 20-30 min then you can take a shower or use a baby bulb with warm water to wash out the ear wax.  Can buy debrox wax removal kit over the counter.  Do not use Qtips  Can do a steroid nasal spary 1-2 sparys at night each nostril.  Examples are nasonex, flonase, nasocort- they are over the counter.  Remember to spray each nostril twice towards the outer part of your eye.   Do not sniff but instead pinch your nose and tilt your head back to help the medicine get into your sinuses.   The best time to do this is at bedtime.  Stop if you get blurred vision or nose bleeds.   THIS WILL TAKE 7 DAYS TO WORK AND IS BETTER IF YOU START BEFORE SYMPTOMS SO IF YOU HAVE A SEASON OR TIME OF THE YEAR YOU ALWAYS GET A COLD, START BEFORE THAT!   Ask insurance and pharmacy about shingrix - it is a 2 part shot that we will not be getting in the office.   Suggest getting AFTER covid vaccines, have to wait at least a month This shot can make you feel bad due to such good immune response it can trigger some inflammation so take tylenol or aleve day of or day after and plan on resting.   Can go to [email or web address] for more information  Shingrix Vaccination  Two vaccines are licensed and recommended to prevent shingles in the [place] Zoster vaccine live (ZVL, Zostavax) has been in use since 2006. Recombinant zoster vaccine (RZV, Shingrix), has been in use since 2017 and is recommended by ACIP as the preferred shingles vaccine.  What Everyone Should Know about Shingles Vaccine (Shingrix) One of the Recommended Vaccines by Disease Shingles vaccination is the only way to protect against shingles and postherpetic neuralgia (PHN), the most common complication from shingles. CDC recommends that healthy adults 50 years and older get two  doses of the shingles vaccine called Shingrix (recombinant zoster vaccine), separated by 2 to 6 months, to prevent shingles and the complications from the disease. Your doctor or pharmacist can give you Shingrix as a shot in your upper arm. Shingrix provides strong protection against shingles and PHN. Two doses of Shingrix is more than 90% effective at preventing shingles and PHN. Protection stays above 85% for at least the first four years after you get vaccinated. Shingrix is the preferred vaccine, over Zostavax (zoster vaccine live), a shingles vaccine in use since 2006. Zostavax may still be used to prevent shingles in healthy adults 60 years and older. For example, you could use Zostavax if a person is allergic to Shingrix, prefers Zostavax, or requests immediate vaccination and Shingrix is unavailable. Who Should Get Shingrix? Healthy adults 50 years and older should get two doses of Shingrix, separated by 2 to 6 months. You should get Shingrix even if in the past you . had shingles  . received Zostavax  . are not sure if you had chickenpox There is no maximum age for getting Shingrix. If you had shingles in the past, you can get Shingrix to help prevent future occurrences of the disease. There is no specific length of time that you need to wait after having shingles before you can receive Shingrix, but generally you should make sure the shingles rash has gone away before getting vaccinated.  You can get Shingrix whether or not you remember having had chickenpox in the past. Studies show that more than 99% of Americans 40 years and older have had chickenpox, even if they don't remember having the disease. Chickenpox and shingles are related because they are caused by the same virus (varicella zoster virus). After a person recovers from chickenpox, the virus stays dormant (inactive) in the body. It can reactivate years later and cause shingles. If you had Zostavax in the recent past, you should wait  at least eight weeks before getting Shingrix. Talk to your healthcare provider to determine the best time to get Shingrix. Shingrix is available in [facility] and pharmacies. To find doctor's offices or pharmacies near you that offer the vaccine, visit [employer] Vaccine FinderExternal. If you have questions about Shingrix, talk with your healthcare provider. Vaccine for Those 34 Years and Older  Shingrix reduces the risk of shingles and PHN by more than 90% in people 67 and older. CDC recommends the vaccine for healthy adults 16 and older.  Who Should Not Get Shingrix? You should not get Shingrix if you: . have ever had a severe allergic reaction to any component of the vaccine or after a dose of Shingrix  . tested negative for immunity to varicella zoster virus. If you test negative, you should get chickenpox vaccine.  . currently have shingles  . currently are pregnant or breastfeeding. Women who are pregnant or breastfeeding should wait to get Shingrix.  Marland Kitchen receive specific antiviral drugs (acyclovir, famciclovir, or valacyclovir) 24 hours before vaccination (avoid use of these antiviral drugs for 14 days after vaccination)- zoster vaccine live only If you have a minor acute (starts suddenly) illness, such as a cold, you may get Shingrix. But if you have a moderate or severe acute illness, you should usually wait until you recover before getting the vaccine. This includes anyone with a temperature of 101.16F or higher. The side effects of the Shingrix are temporary, and usually last 2 to 3 days. While you may experience pain for a few days after getting Shingrix, the pain will be less severe than having shingles and the complications from the disease. How Well Does Shingrix Work? Two doses of Shingrix provides strong protection against shingles and postherpetic neuralgia (PHN), the most common complication of shingles. . In adults 50 to 70 years old who got two doses, Shingrix was 97%  effective in preventing shingles; among adults 70 years and older, Shingrix was 91% effective.  . In adults 77 to 70 years old who got two doses, Shingrix was 91% effective in preventing PHN; among adults 70 years and older, Shingrix was 89% effective. Shingrix protection remained high (more than 85%) in people 70 years and older throughout the four years following vaccination. Since your risk of shingles and PHN increases as you get older, it is important to have strong protection against shingles in your older years. Top of Page  What Are the Possible Side Effects of Shingrix? Studies show that Shingrix is safe. The vaccine helps your body create a strong defense against shingles. As a result, you are likely to have temporary side effects from getting the shots. The side effects may affect your ability to do normal daily activities for 2 to 3 days. Most people got a sore arm with mild or moderate pain after getting Shingrix, and some also had redness and swelling where they got the shot. Some people felt tired, had muscle pain, a headache, shivering, fever, stomach pain,  or nausea. About 1 out of 6 people who got Shingrix experienced side effects that prevented them from doing regular activities. Symptoms went away on their own in about 2 to 3 days. Side effects were more common in younger people. You might have a reaction to the first or second dose of Shingrix, or both doses. If you experience side effects, you may choose to take over-the-counter pain medicine such as ibuprofen or acetaminophen. If you experience side effects from Shingrix, you should report them to the Vaccine Adverse Event Reporting System (VAERS). Your doctor might file this report, or you can do it yourself through the VAERS websiteExternal, or by calling [phone]. If you have any questions about side effects from Shingrix, talk with your doctor. The shingles vaccine does not contain thimerosal (a preservative containing  mercury). Top of Page  When Should I See a Doctor Because of the Side Effects I Experience From Shingrix? In clinical trials, Shingrix was not associated with serious adverse events. In fact, serious side effects from vaccines are extremely rare. For example, for every 1 million doses of a vaccine given, only one or two people may have a severe allergic reaction. Signs of an allergic reaction happen within minutes or hours after vaccination and include hives, swelling of the face and throat, difficulty breathing, a fast heartbeat, dizziness, or weakness. If you experience these or any other life-threatening symptoms, see a doctor right away. Shingrix causes a strong response in your immune system, so it may produce short-term side effects more intense than you are used to from other vaccines. These side effects can be uncomfortable, but they are expected and usually go away on their own in 2 or 3 days. Top of Page  How Can I Pay For Shingrix? There are several ways shingles vaccine may be paid for: Medicare . Medicare Part D plans cover the shingles vaccine, but there may be a cost to you depending on your plan. There may be a copay for the vaccine, or you may need to pay in full then get reimbursed for a certain amount.  . Medicare Part B does not cover the shingles vaccine. Medicaid . Medicaid may or may not cover the vaccine. Contact your insurer to find out. Private health insurance . Many private health insurance plans will cover the vaccine, but there may be a cost to you depending on your plan. Contact your insurer to find out. Vaccine assistance programs . Some pharmaceutical companies provide vaccines to eligible adults who cannot afford them. You may want to check with the vaccine manufacturer, [employer], about Shingrix. If you do not currently have health insurance, learn more about affordable health coverage optionsExternal. To find doctor's offices or pharmacies near you that  offer the vaccine, visit HealthMap Vaccine FinderExternal.

## 2019-09-14 LAB — LIPID PANEL
Cholesterol: 230 mg/dL — ABNORMAL HIGH (ref <200)
HDL: 85 mg/dL (ref >=50)
LDL Cholesterol (Calc): 119 mg/dL (calc) — ABNORMAL HIGH
Non-HDL Cholesterol (Calc): 145 mg/dL (calc) — ABNORMAL HIGH (ref <130)
Total CHOL/HDL Ratio: 2.7 (calc) (ref <5.0)
Triglycerides: 149 mg/dL (ref <150)

## 2019-09-14 LAB — COMPLETE METABOLIC PANEL WITH GFR
AG Ratio: 1.8 (calc) (ref 1.0–2.5)
ALT: 13 U/L (ref 6–29)
AST: 17 U/L (ref 10–35)
Albumin: 4.5 g/dL (ref 3.6–5.1)
Alkaline phosphatase (APISO): 57 U/L (ref 37–153)
BUN: 18 mg/dL (ref 7–25)
CO2: 31 mmol/L (ref 20–32)
Calcium: 10.2 mg/dL (ref 8.6–10.4)
Chloride: 100 mmol/L (ref 98–110)
Creat: 0.91 mg/dL (ref 0.60–0.93)
GFR, Est African American: 74 mL/min/{1.73_m2} (ref >=60)
GFR, Est Non African American: 64 mL/min/{1.73_m2} (ref >=60)
Globulin: 2.5 g/dL (calc) (ref 1.9–3.7)
Glucose, Bld: 81 mg/dL (ref 65–99)
Potassium: 4.3 mmol/L (ref 3.5–5.3)
Sodium: 139 mmol/L (ref 135–146)
Total Bilirubin: 0.5 mg/dL (ref 0.2–1.2)
Total Protein: 7 g/dL (ref 6.1–8.1)

## 2019-09-14 LAB — CBC WITH DIFFERENTIAL/PLATELET
Absolute Monocytes: 604 cells/uL (ref 200–950)
Basophils Absolute: 28 cells/uL (ref 0–200)
Basophils Relative: 0.4 %
Eosinophils Absolute: 149 cells/uL (ref 15–500)
Eosinophils Relative: 2.1 %
HCT: 45.3 % — ABNORMAL HIGH (ref 35.0–45.0)
Hemoglobin: 15.1 g/dL (ref 11.7–15.5)
Lymphs Abs: 2265 cells/uL (ref 850–3900)
MCH: 30.5 pg (ref 27.0–33.0)
MCHC: 33.3 g/dL (ref 32.0–36.0)
MCV: 91.5 fL (ref 80.0–100.0)
MPV: 8.7 fL (ref 7.5–12.5)
Monocytes Relative: 8.5 %
Neutro Abs: 4054 cells/uL (ref 1500–7800)
Neutrophils Relative %: 57.1 %
Platelets: 250 10*3/uL (ref 140–400)
RBC: 4.95 10*6/uL (ref 3.80–5.10)
RDW: 12.9 % (ref 11.0–15.0)
Total Lymphocyte: 31.9 %
WBC: 7.1 10*3/uL (ref 3.8–10.8)

## 2019-09-19 ENCOUNTER — Other Ambulatory Visit: Payer: Self-pay

## 2019-09-19 ENCOUNTER — Ambulatory Visit (INDEPENDENT_AMBULATORY_CARE_PROVIDER_SITE_OTHER): Payer: Medicare Other | Admitting: Internal Medicine

## 2019-09-19 ENCOUNTER — Encounter: Payer: Self-pay | Admitting: Internal Medicine

## 2019-09-19 VITALS — BP 102/60 | HR 61 | Ht 61.25 in | Wt 121.0 lb

## 2019-09-19 DIAGNOSIS — E559 Vitamin D deficiency, unspecified: Secondary | ICD-10-CM

## 2019-09-19 DIAGNOSIS — M810 Age-related osteoporosis without current pathological fracture: Secondary | ICD-10-CM

## 2019-09-19 NOTE — Progress Notes (Signed)
Patient ID: Sheila Gray, female   DOB: 07-18-1950, 69 y.o.   MRN: [id number]   This visit occurred during the SARS-CoV-2 public health emergency.  Safety protocols were in place, including screening questions prior to the visit, additional usage of staff PPE, and extensive cleaning of exam room while observing appropriate contact time as indicated for disinfecting solutions.   HPI  Sheila Gray is a 69 y.o.-year-old female, returning for follow-up for osteoporosis.  She also has a vitamin D deficiency.  Last visit 1.5 years ago.  Pt was dx with OP in 2016. She had a ruptured disk in 2016 >> less active. In 2018, T scores were worse.  Since then, she started to exercise and to be less sedentary  Reviewed previous DXA scan reports: Date L1-L4 T score FN T score 33% distal Radius  06/15/2019 ([facility])  -1.7 (+7.3%*) RFN: -2.8 (+0.9%) LFN: -3.2 (-4.1%) n/a  04/01/2018 ([facility])  -2.20 (-2.9%*) RFN: -2.8 LFN: -3.0 n/a  11/12/2016 ([facility])  -2.0 (-6.3%*) RFN: -3.0 LFN: -3.4 n/a  11/09/2014 ([facility])  -1.6 RFN: -2.3 LFN: -2.6 n/a   No falls or fractures since last visit.  No dizziness, vertigo, orthostasis, poor vision.  Reviewed previous osteoporotic treatments: - Actonel 1990s - Fosamax 1990s  Took bisphosphonates for 6 years. >> stopped when aunt developed ONJ (she had metastatic cancer) - Calcitonin ~2 years in 2014, then ~1.5 years 2 years ago.  She started testosterone 03/2017 and continued until 02/2018, when her mother got sick.  She restarted since then.  She cut out a lot of dairy, eggs.  Before last visit he started to drink almond milk. She added back meat and cheese >> Lipids increased.  She has a distant history of vitamin D deficiency, but recent levels have been normal Lab Results  Component Value Date   VD25OH 60 05/31/2019   VD25OH 76 03/14/2018   VD25OH 65 01/25/2017   VD25OH 54 12/12/2015   VD25OH 71 06/19/2015   VD25OH 60 11/13/2014   VD25OH  60 11/09/2013   On calcium citrate 500 mg daily + multivitamins; she is on vitamin D supplement 4000 units daily + multivitamin.  She is still walking 4 miles per weekend; still doing [employer].  She was also going to [facility] for skeletal loading but stopped 02/2018.  She does not take high vitamin A doses.  Menopause was at 69 years old-was on tamoxifen- prophylactically (!) in the 90s as she had extensive FH of BrCA.  Pt does have a FH of osteoporosis MGM, M aunt  - broke hips (?).  Osteoporosis in her mother, but unclear).  No history of kidney stones.  No history of persistent hyper or hypocalcemia or hyperparathyroidism: Lab Results  Component Value Date   PTH 37 01/27/2017   CALCIUM 10.2 09/13/2019   CALCIUM 9.6 05/31/2019   CALCIUM 9.9 03/14/2018   CALCIUM 10.0 07/26/2017   CALCIUM 9.9 01/27/2017   CALCIUM 10.7 (H) 01/25/2017   CALCIUM 9.6 07/22/2016   CALCIUM 9.5 12/12/2015   CALCIUM 10.3 06/19/2015   CALCIUM 10.1 11/13/2014   No history of thyrotoxicosis.  Latest TSH levels normal: Lab Results  Component Value Date   TSH 4.23 05/31/2019   TSH 3.78 03/14/2018   TSH 2.83 07/26/2017   TSH 2.91 01/25/2017   TSH 1.67 07/22/2016   No history of CKD. Last BUN/Cr: Lab Results  Component Value Date   BUN 18 09/13/2019   CREATININE 0.91 09/13/2019   ROS:  Constitutional: no weight gain/no weight loss, no fatigue, no subjective hyperthermia, no subjective hypothermia Eyes: no blurry vision, no xerophthalmia ENT: no sore throat, no nodules palpated in neck, no dysphagia, no odynophagia, no hoarseness Cardiovascular: no CP/no SOB/no palpitations/no leg swelling Respiratory: no cough/no SOB/no wheezing Gastrointestinal: no N/no V/no D/no C/no acid reflux Musculoskeletal: no muscle aches/no joint aches Skin: no rashes, no hair loss Neurological: no tremors/no numbness/no tingling/no dizziness  I reviewed pt's medications, allergies, PMH, social hx, family hx, and  changes were documented in the history of present illness. Otherwise, unchanged from my initial visit note.  Past Medical History:  Diagnosis Date  . Allergy   . Osteoporosis   . Vitamin D deficiency    Hyperlipidemia  C5-C6 stenosis  Rosacea  Lumbar ruptured disc 2016  No past surgical history on file. Social History   Socioeconomic History  . Marital status: Married    Spouse name: Not on file  . Number of children: 3  Social Needs  Occupational History  .  Works in an [employer]  Tobacco Use  . Smoking status: Never Smoker  . Smokeless tobacco: Never Used  Substance and Sexual Activity  . Alcohol use: Yes    Alcohol/week: 1.5 oz    Types: 3 drink(s) per week  . Drug use: No   Current Outpatient Medications on File Prior to Visit  Medication Sig Dispense Refill  . acetaminophen (TYLENOL) 500 MG tablet Take 500 mg as needed by mouth.    Marland Kitchen aspirin EC 81 MG tablet Take 81 mg by mouth daily.    . Calcium Citrate-Vitamin D (CALCIUM CITRATE + D3 PO) Take 4 tablets daily by mouth.     . Cholecalciferol (VITAMIN D PO) Take 4,000 Units by mouth daily.    . Famotidine (PEPCID PO) Take by mouth.    . fexofenadine (ALLEGRA) 180 MG tablet Take 180 mg by mouth daily.    . LUTEIN PO Take 1 capsule by mouth daily.    . metroNIDAZOLE (METROCREAM) 0.75 % cream APPLY TO FACE AT BEDTIME  10  . Multiple Vitamin (MULTIVITAMIN) tablet Take 1 tablet by mouth daily.    Marland Kitchen OVER THE COUNTER MEDICATION     . Probiotic Product (PROBIOTIC PO) Take by mouth.    . Zinc 25 MG TABS Take by mouth.     No current facility-administered medications on file prior to visit.   Allergies  Allergen Reactions  . Amoxicillin Rash   Family History  Problem Relation Age of Onset  . Hypertension Mother   . Kidney disease Father   . Heart disease Father     PE: BP 102/60   Pulse 61   Ht 5' 1.25" (1.556 m) Comment: measured today without shoes  Wt 121 lb (54.9 kg)   SpO2 96%   BMI 22.68  kg/m  Wt Readings from Last 3 Encounters:  09/19/19 121 lb (54.9 kg)  09/13/19 122 lb (55.3 kg)  05/31/19 121 lb (54.9 kg)   Constitutional: normal weight, in NAD Eyes: PERRLA, EOMI, no exophthalmos ENT: moist mucous membranes, no thyromegaly, no cervical lymphadenopathy Cardiovascular: RRR, No MRG Respiratory: CTA B Gastrointestinal: abdomen soft, NT, ND, BS+ Musculoskeletal: no deformities, strength intact in all 4 Skin: moist, warm, no rashes Neurological: no tremor with outstretched hands, DTR normal in all 4  Assessment: 1. Osteoporosis  2.  Vitamin D deficiency  Plan: 1. Osteoporosis -Likely age-related + postmenopausal.  She also has a family history of osteoporosis -Reviewed DXA  scan report from 2016, 2018, 2020, 2021.  There was a discrepancy between the scores from 2016 and 2018 (steep decrease in T-scores), most likely due to changing the DXA machine between the states.  However, this could also be explained by the patient becoming relatively inactive after her ruptured intervertebral disc in 2016.  Since then, she restarted exercise: Walking, but not much weightbearing.  She was also doing balance exercises/Qi gong and I suggested the National osteoporosis foundation guidelines for exercise in osteoporosis.  I also recommended the [facility] for skeletal loading, which she started and she tried for almost a year.  She had to stop when her mother was sick at the end of 2019.  In 2020, her T-scores improved significantly at hips but they were slightly lower at the level of the spine.  At last visit, she was reticent to start medications as she was afraid of possible side effects.  Latest bone density was from 06/2019 and this showed a significant 1 in the level of the spine, stability at the right femoral neck slightly lower T score at the left femoral neck, however, this was not statistically significant. -At this visit, she does not report any falls or fractures.  Again  discussed about fall precautions. -She continues to take a good amount of calcium and vitamin D - she also continues exercise - walking and once a week Qi gong >> discussed to add weight bearing exercises and more frequent qi gong.  Also, she plans to return to [employer].  - She is not a smoker and does not drink more than 2 drinks of alcohol a day. -Recent BMP was reviewed and this was normal from 05/2019 so we do not need to repeat it today - I will see her back in a year  2.  Vtamin D deficiency -She continues on 4000 units vitamin D daily + multivitamin -Latest vitamin D level was reviewed from 05/2019 and this was normal: 60 -We will not recheck level today   Philemon Kingdom, MD PhD [facility]

## 2019-09-19 NOTE — Patient Instructions (Signed)
Please consider restarting [facility].  Restart Weight bearing exercises.  Please come back for a follow-up appointment in 1 year.

## 2019-10-18 DIAGNOSIS — Z1231 Encounter for screening mammogram for malignant neoplasm of breast: Secondary | ICD-10-CM | POA: Diagnosis not present

## 2019-10-18 LAB — HM MAMMOGRAPHY

## 2019-12-05 NOTE — Progress Notes (Addendum)
History of Present Illness:       This very nice 69 y.o.  MWF presents for 6  month follow up with HTN, HLD, Pre-Diabetes and Vitamin D Deficiency. Patient had long hx/o Osteoporosis.      Patient is followed expectantly for labile HTN & BP has been controlled at home. Today's BP is at goal -  116/68. Patient has had no complaints of any cardiac type chest pain, palpitations, dyspnea / orthopnea / PND, dizziness, claudication, or dependent edema.      Hyperlipidemia is controlled with diet & meds. Patient denies myalgias or other med SE's. Last Lipids were not at goal:  Lab Results  Component Value Date   CHOL 230 (H) 09/13/2019   HDL 85 09/13/2019   LDLCALC 119 (H) 09/13/2019   TRIG 149 09/13/2019   CHOLHDL 2.7 09/13/2019    Also, the patient has history of PreDiabetes and has had no symptoms of reactive hypoglycemia, diabetic polys, paresthesias or visual blurring.  Last A1c was Normal & at goal:  Lab Results  Component Value Date   HGBA1C 5.0 05/31/2019           Further, the patient also has history of Vitamin D Deficiency and supplements vitamin D without any suspected side-effects. Last vitamin D was at goal:  Lab Results  Component Value Date   VD25OH 60 05/31/2019    Current Outpatient Medications on File Prior to Visit  Medication Sig  . acetaminophen (TYLENOL) 500 MG tablet Take 500 mg as needed by mouth.  Marland Kitchen aspirin EC 81 MG tablet Take 81 mg by mouth daily.  . Calcium Citrate-Vitamin D (CALCIUM CITRATE + D3 PO) Take 4 tablets daily by mouth.   . Cholecalciferol (VITAMIN D PO) Take 4,000 Units by mouth daily.  . Famotidine (PEPCID PO) Take by mouth.  . fexofenadine (ALLEGRA) 180 MG tablet Take 180 mg by mouth daily.  . LUTEIN PO Take 1 capsule by mouth daily.  . metroNIDAZOLE (METROCREAM) 0.75 % cream APPLY TO FACE AT BEDTIME  . Multiple Vitamin (MULTIVITAMIN) tablet Take 1 tablet by mouth daily.  Marland Kitchen OVER THE COUNTER MEDICATION   . Probiotic Product  (PROBIOTIC PO) Take by mouth.  . Zinc 25 MG TABS Take by mouth.     Allergies  Allergen Reactions  . Amoxicillin Rash    PMHx:   Past Medical History:  Diagnosis Date  . Allergy   . Osteoporosis   . Vitamin D deficiency     History reviewed. No pertinent surgical history.  FHx:    Reviewed / unchanged  SHx:    Reviewed / unchanged   Systems Review:  Constitutional: Denies fever, chills, wt changes, headaches, insomnia, fatigue, night sweats, change in appetite. Eyes: Denies redness, blurred vision, diplopia, discharge, itchy, watery eyes.  ENT: Denies discharge, congestion, post nasal drip, epistaxis, sore throat, earache, hearing loss, dental pain, tinnitus, vertigo, sinus pain, snoring.  CV: Denies chest pain, palpitations, irregular heartbeat, syncope, dyspnea, diaphoresis, orthopnea, PND, claudication or edema. Respiratory: denies cough, dyspnea, DOE, pleurisy, hoarseness, laryngitis, wheezing.  Gastrointestinal: Denies dysphagia, odynophagia, heartburn, reflux, water brash, abdominal pain or cramps, nausea, vomiting, bloating, diarrhea, constipation, hematemesis, melena, hematochezia  or hemorrhoids. Genitourinary: Denies dysuria, frequency, urgency, nocturia, hesitancy, discharge, hematuria or flank pain. Musculoskeletal: Denies arthralgias, myalgias, stiffness, jt. swelling, pain, limping or strain/sprain.  Skin: Denies pruritus, rash, hives, warts, acne, eczema or change in skin lesion(s). Neuro: No weakness, tremor, incoordination, spasms, paresthesia or pain. Psychiatric:  Denies confusion, memory loss or sensory loss. Endo: Denies change in weight, skin or hair change.  Heme/Lymph: No excessive bleeding, bruising or enlarged lymph nodes.  Physical Exam  BP 116/68   P73   T 98.6 F   Wt 121 lb    SpO2 98%   BMI 22.68 kg/m   Appears  well nourished, well groomed  and in no distress.  Eyes: PERRLA, EOMs, conjunctiva no swelling or erythema. Sinuses: No  frontal/maxillary tenderness ENT/Mouth: EAC's clear, TM's nl w/o erythema, bulging. Nares clear w/o erythema, swelling, exudates. Oropharynx clear without erythema or exudates. Oral hygiene is good. Tongue normal, non obstructing. Hearing intact.  Neck: Supple. Thyroid not palpable. Car 2+/2+ without bruits, nodes or JVD. Chest: Respirations nl with BS clear & equal w/o rales, rhonchi, wheezing or stridor.  Cor: Heart sounds normal w/ regular rate and rhythm without sig. murmurs, gallops, clicks or rubs. Peripheral pulses normal and equal  without edema.  Abdomen: Soft & bowel sounds normal. Non-tender w/o guarding, rebound, hernias, masses or organomegaly.  Lymphatics: Unremarkable.  Musculoskeletal: Full ROM all peripheral extremities, joint stability, 5/5 strength and normal gait.  Skin: Warm, dry without exposed rashes, lesions or ecchymosis apparent.  Neuro: Cranial nerves intact, reflexes equal bilaterally. Sensory-motor testing grossly intact. Tendon reflexes grossly intact.  Pysch: Alert & oriented x 3.  Insight and judgement nl & appropriate. No ideations.  Assessment and Plan:  1. Elevated BP without diagnosis of hypertension  - Continue medication, monitor blood pressure at home.  - Continue DASH diet.  Reminder to go to the ER if any CP,  SOB, nausea, dizziness, severe HA, changes vision/speech.  - CBC with Differential/Platelet - COMPLETE METABOLIC PANEL WITH GFR - Magnesium - TSH  2. Hyperlipidemia, mixed  - Continue diet/meds, exercise,& lifestyle modifications.  - Continue monitor periodic cholesterol/liver & renal functions   - Lipid panel  3. Abnormal glucose  - Continue diet, exercise  - Lifestyle modifications.  - Monitor appropriate labs.  - Hemoglobin A1c - Insulin, random  4. Vitamin D deficiency  - Continue supplementation.  - VITAMIN D 25 Hydrox  5. Medication management  - CBC with Differential/Platelet - COMPLETE METABOLIC PANEL WITH GFR -  Magnesium - Lipid panel - TSH - Hemoglobin A1c - Insulin, random - VITAMIN D 25 Hydroxy          Discussed  regular exercise, BP monitoring, weight control to achieve/maintain BMI less than 25 and discussed med and SE's. Recommended labs to assess and monitor clinical status with further disposition pending results of labs.  I discussed the assessment and treatment plan with the patient. The patient was provided an opportunity to ask questions and all were answered. The patient agreed with the plan and demonstrated an understanding of the instructions.  I provided over 30 minutes of exam, counseling, chart review and  complex critical decision making.    Kirtland Bouchard, MD

## 2019-12-06 ENCOUNTER — Encounter: Payer: Self-pay | Admitting: Internal Medicine

## 2019-12-06 ENCOUNTER — Other Ambulatory Visit: Payer: Self-pay

## 2019-12-06 ENCOUNTER — Ambulatory Visit (INDEPENDENT_AMBULATORY_CARE_PROVIDER_SITE_OTHER): Payer: Medicare Other | Admitting: Internal Medicine

## 2019-12-06 VITALS — BP 116/68 | HR 73 | Temp 98.6°F | Resp 12 | Ht 61.25 in | Wt 121.0 lb

## 2019-12-06 DIAGNOSIS — R7309 Other abnormal glucose: Secondary | ICD-10-CM

## 2019-12-06 DIAGNOSIS — Z79899 Other long term (current) drug therapy: Secondary | ICD-10-CM

## 2019-12-06 DIAGNOSIS — E782 Mixed hyperlipidemia: Secondary | ICD-10-CM | POA: Diagnosis not present

## 2019-12-06 DIAGNOSIS — E559 Vitamin D deficiency, unspecified: Secondary | ICD-10-CM

## 2019-12-06 DIAGNOSIS — R030 Elevated blood-pressure reading, without diagnosis of hypertension: Secondary | ICD-10-CM | POA: Diagnosis not present

## 2019-12-06 NOTE — Patient Instructions (Signed)

## 2019-12-07 ENCOUNTER — Other Ambulatory Visit: Payer: Self-pay | Admitting: Internal Medicine

## 2019-12-07 DIAGNOSIS — E782 Mixed hyperlipidemia: Secondary | ICD-10-CM

## 2019-12-07 LAB — COMPLETE METABOLIC PANEL WITH GFR
AG Ratio: 1.8 (calc) (ref 1.0–2.5)
ALT: 14 U/L (ref 6–29)
AST: 19 U/L (ref 10–35)
Albumin: 4.8 g/dL (ref 3.6–5.1)
Alkaline phosphatase (APISO): 57 U/L (ref 37–153)
BUN: 16 mg/dL (ref 7–25)
CO2: 28 mmol/L (ref 20–32)
Calcium: 10.9 mg/dL — ABNORMAL HIGH (ref 8.6–10.4)
Chloride: 102 mmol/L (ref 98–110)
Creat: 0.9 mg/dL (ref 0.60–0.93)
GFR, Est African American: 75 mL/min/{1.73_m2} (ref >=60)
GFR, Est Non African American: 65 mL/min/{1.73_m2} (ref >=60)
Globulin: 2.6 g/dL (calc) (ref 1.9–3.7)
Glucose, Bld: 88 mg/dL (ref 65–99)
Potassium: 4.3 mmol/L (ref 3.5–5.3)
Sodium: 141 mmol/L (ref 135–146)
Total Bilirubin: 0.5 mg/dL (ref 0.2–1.2)
Total Protein: 7.4 g/dL (ref 6.1–8.1)

## 2019-12-07 LAB — CBC WITH DIFFERENTIAL/PLATELET
Absolute Monocytes: 497 cells/uL (ref 200–950)
Basophils Absolute: 49 cells/uL (ref 0–200)
Basophils Relative: 0.7 %
Eosinophils Absolute: 196 cells/uL (ref 15–500)
Eosinophils Relative: 2.8 %
HCT: 45.8 % — ABNORMAL HIGH (ref 35.0–45.0)
Hemoglobin: 15.3 g/dL (ref 11.7–15.5)
Lymphs Abs: 2051 cells/uL (ref 850–3900)
MCH: 31.2 pg (ref 27.0–33.0)
MCHC: 33.4 g/dL (ref 32.0–36.0)
MCV: 93.3 fL (ref 80.0–100.0)
MPV: 9.3 fL (ref 7.5–12.5)
Monocytes Relative: 7.1 %
Neutro Abs: 4207 cells/uL (ref 1500–7800)
Neutrophils Relative %: 60.1 %
Platelets: 258 10*3/uL (ref 140–400)
RBC: 4.91 10*6/uL (ref 3.80–5.10)
RDW: 12.5 % (ref 11.0–15.0)
Total Lymphocyte: 29.3 %
WBC: 7 10*3/uL (ref 3.8–10.8)

## 2019-12-07 LAB — LIPID PANEL
Cholesterol: 229 mg/dL — ABNORMAL HIGH (ref <200)
HDL: 85 mg/dL (ref >=50)
LDL Cholesterol (Calc): 114 mg/dL (calc) — ABNORMAL HIGH
Non-HDL Cholesterol (Calc): 144 mg/dL (calc) — ABNORMAL HIGH (ref <130)
Total CHOL/HDL Ratio: 2.7 (calc) (ref <5.0)
Triglycerides: 179 mg/dL — ABNORMAL HIGH (ref <150)

## 2019-12-07 LAB — INSULIN, RANDOM: Insulin: 4.7 u[IU]/mL

## 2019-12-07 LAB — MAGNESIUM: Magnesium: 2.3 mg/dL (ref 1.5–2.5)

## 2019-12-07 LAB — TSH: TSH: 3.57 mIU/L (ref 0.40–4.50)

## 2019-12-07 LAB — HEMOGLOBIN A1C
Hgb A1c MFr Bld: 5.1 % of total Hgb (ref <5.7)
Mean Plasma Glucose: 100 (calc)
eAG (mmol/L): 5.5 (calc)

## 2019-12-07 LAB — VITAMIN D 25 HYDROXY (VIT D DEFICIENCY, FRACTURES): Vit D, 25-Hydroxy: 85 ng/mL (ref 30–100)

## 2019-12-07 MED ORDER — EZETIMIBE 10 MG PO TABS: 10.0000 mg | ORAL_TABLET | Freq: Every day | ORAL | 1 refills | Status: DC

## 2019-12-07 NOTE — Progress Notes (Signed)
========================================================== -   Test results slightly outside the reference range are not unusual. If there is anything important, I will review this with you,  otherwise it is considered normal test values.  If you have further questions,  please do not hesitate to contact me at the office or via My Chart.  ==========================================================  -  Total Chol = 229 elevated in part due to high level of Good HDL  Chol  Level = 85,  - But ...................  - Bad LDL Chol = 114 is too high,   (goal is below 100, But Ideal is below 70  !  )   - So, Sent new Rx to [facility] for Zetia (Ezetimibe) - which is not a "statin",  does it's work in the GI tract to lower Chol and is not absorbed into the  blood stream  - Also, it's a generic which means a low co-pay.  - Please call office to schedule an Office Visit in 3-4 month to  recheck Chol labs ==========================================================  - A1c - Normal - Great - No Diabetes ==========================================================  -    Vitamin D = 85 - Excellent  ==========================================================  -  All Else - CBC - Kidneys - Electrolytes - Liver - Magnesium & Thyroid    - all  Normal / OK ==========================================================   - Keep up the Great Work !  ==========================================================            -

## 2019-12-25 ENCOUNTER — Other Ambulatory Visit: Payer: Self-pay

## 2019-12-25 ENCOUNTER — Ambulatory Visit (INDEPENDENT_AMBULATORY_CARE_PROVIDER_SITE_OTHER): Payer: Medicare Other | Admitting: *Deleted

## 2019-12-25 VITALS — Temp 97.6°F

## 2019-12-25 DIAGNOSIS — Z23 Encounter for immunization: Secondary | ICD-10-CM

## 2020-01-02 ENCOUNTER — Encounter: Payer: Self-pay | Admitting: *Deleted

## 2020-01-05 DIAGNOSIS — Z23 Encounter for immunization: Secondary | ICD-10-CM | POA: Diagnosis not present

## 2020-04-24 ENCOUNTER — Other Ambulatory Visit: Payer: Self-pay | Admitting: Internal Medicine

## 2020-04-24 ENCOUNTER — Telehealth: Payer: Self-pay | Admitting: *Deleted

## 2020-04-24 DIAGNOSIS — Z79899 Other long term (current) drug therapy: Secondary | ICD-10-CM

## 2020-04-24 DIAGNOSIS — E782 Mixed hyperlipidemia: Secondary | ICD-10-CM

## 2020-04-24 NOTE — Telephone Encounter (Signed)
Returned call to patient regarding labs recheck. Per Dr Melford Aase, [place] to check cholesterol labs now or wait until 06/10/2020 physical. Patient prefers to wait unit physical.

## 2020-05-13 ENCOUNTER — Other Ambulatory Visit: Payer: Self-pay | Admitting: Internal Medicine

## 2020-05-13 DIAGNOSIS — E782 Mixed hyperlipidemia: Secondary | ICD-10-CM

## 2020-06-07 ENCOUNTER — Encounter: Payer: Self-pay | Admitting: Internal Medicine

## 2020-06-07 NOTE — Progress Notes (Signed)
Comprehensive Evaluation &  Examination  Future Appointments  Date Time Provider [place]  06/16/2021 10:00 AM Unk Pinto, MD [facility] None        This very nice 70 y.o. MWF presents for a  comprehensive evaluation and management of multiple medical co-morbidities.  Patient has been followed for HTN, HLD, glucose intolerance  and Vitamin D Deficiency. Patient is Followed by Dr Benjiman Core for Osteoporosis predating back since the 1990's.       Patient is followed expectantly for labile HTN. Patient's BP has been controlled at home and patient denies any cardiac symptoms as chest pain, palpitations, shortness of breath, dizziness or ankle swelling. Today's BP is at goal -  104/66.       Patient's hyperlipidemia is not controlled with diet and since she's intolerant to Statins , she was started on Ezetimibe. Patient denies myalgias or other medication SE's. Last lipids on diet alone were not at goal:  Lab Results  Component Value Date   CHOL 229 (H) 12/06/2019   HDL 85 12/06/2019   LDLCALC 114 (H) 12/06/2019   TRIG 179 (H) 12/06/2019   CHOLHDL 2.7 12/06/2019        Patient is monitored expectantly for glucose intolerance and patient denies reactive hypoglycemic symptoms, visual blurring, diabetic polys or paresthesias. Last A1c was normal & at goal:  Lab Results  Component Value Date   HGBA1C 5.1 12/06/2019       Finally, patient has history of Vitamin D Deficiency and last Vitamin D was at goal:  Lab Results  Component Value Date   VD25OH 80 12/06/2019    Current Outpatient Medications on File Prior to Visit  Medication Sig  . acetaminophen500 MG tablet Take  As needed  . aspirin EC 81 MG tablet Take  daily.  . Calcium Citrate-Vitamin D  Take 4 tablets daily    . VITAMIN D 4,000 Units Take  daily.  Marland Kitchen ezetimibe (ZETIA) 10 MG tablet TAKE 1 TABLET  EVERY DAY  . PEPCID Take daily  . fexofenadine180 MG tablet Take  daily.  . LUTEIN  Take 1 capsule   daily.  Marland Kitchen METROCREAM 0.75 % cream APPLY TO FACE AT BEDTIME  . Multiple Vitamin Take 1 tablet  daily.  . Probiotic  Take daily  . Zinc 25 MG TABS Take daily     Allergies  Allergen Reactions  . Amoxicillin Rash    Past Medical History:  Diagnosis Date  . Allergy   . Osteoporosis   . Vitamin D deficiency     Health Maintenance  Topic Date Due  . COVID-19 Vaccine (3 - Booster for Moderna series) 12/17/2019  . MAMMOGRAM  10/17/2020  . DEXA SCAN  06/14/2021  . COLONOSCOPY  06/25/2021  . TETANUS/TDAP  12/11/2025  . INFLUENZA VACCINE  Completed  . Hepatitis C Screening  Completed  . PNA vac Low Risk Adult  Completed  . HPV VACCINES  Aged Out    Immunization History  Administered Date(s) Administered  . Influenza, High Dose Seasonal PF 12/20/2018, 12/25/2019  . Influenza,inj,Quad PF,6+ Mos 12/14/2016  . Influenza 11/29/2013, 12/09/2015, 12/01/2017  . Moderna Sars-Covid-2 Vacc 04/14/2019, 05/15/2019  . PPD Test 11/09/2013  . Pneumococcal Conjugate-13 03/29/2014  . Pneumococcal -23 01/09/2016  . Td 05/14/2005, 12/12/2015  . Zoster 11/28/2009    Last Colon -  08.12.2019 - Dr Bucccimi  -  Adenoma. rectal polyp - recc 3 yr f/u due Aug 2022.   Last MGM -  08/04//2021  History reviewed. No pertinent surgical history.  Family History  Problem Relation Age of Onset  . Hypertension Mother   . Kidney disease Father   . Heart disease Father     Social History   Tobacco Use  . Smoking status: Never Smoker  . Smokeless tobacco: Never Used  Substance Use Topics  . Alcohol use: Yes    Alcohol/week: 3.0 standard drinks    Types: 3 drink(s) per week  . Drug use: No                                                                                                                            ROS Constitutional: Denies fever, chills, weight loss/gain, headaches, insomnia,  night sweats, and change in appetite. Does c/o fatigue. Eyes: Denies redness, blurred vision, diplopia,  discharge, itchy, watery eyes.  ENT: Denies discharge, congestion, post nasal drip, epistaxis, sore throat, earache, hearing loss, dental pain, Tinnitus, Vertigo, Sinus pain, snoring.  Cardio: Denies chest pain, palpitations, irregular heartbeat, syncope, dyspnea, diaphoresis, orthopnea, PND, claudication, edema Respiratory: denies cough, dyspnea, DOE, pleurisy, hoarseness, laryngitis, wheezing.  Gastrointestinal: Denies dysphagia, heartburn, reflux, water brash, pain, cramps, nausea, vomiting, bloating, diarrhea, constipation, hematemesis, melena, hematochezia, jaundice, hemorrhoids Genitourinary: Denies dysuria, frequency, urgency, nocturia, hesitancy, discharge, hematuria, flank pain Breast: Breast lumps, nipple discharge, bleeding.  Musculoskeletal: Denies arthralgia, myalgia, stiffness, Jt. Swelling, pain, limp, and strain/sprain. Denies falls. Skin: Denies puritis, rash, hives, warts, acne, eczema, changing in skin lesion Neuro: No weakness, tremor, incoordination, spasms, paresthesia, pain Psychiatric: Denies confusion, memory loss, sensory loss. Denies Depression. Endocrine: Denies change in weight, skin, hair change, nocturia, and paresthesia, diabetic polys, visual blurring, hyper / hypo glycemic episodes.  Heme/Lymph: No excessive bleeding, bruising, enlarged lymph nodes.  Physical Exam  BP 104/66   Pulse 77   Temp 97.7 F (36.5 C)   Resp 16   Ht 5' 1.25" (1.556 m)   Wt 119 lb 12.8 oz (54.3 kg)   SpO2 99%   BMI 22.45 kg/m   General Appearance: Well nourished, well groomed and in no apparent distress.  Eyes: PERRLA, EOMs, conjunctiva no swelling or erythema, normal fundi and vessels. Sinuses: No frontal/maxillary tenderness ENT/Mouth: EACs patent / TMs  nl. Nares clear without erythema, swelling, mucoid exudates. Oral hygiene is good. No erythema, swelling, or exudate. Tongue normal, non-obstructing. Tonsils not swollen or erythematous. Hearing normal.  Neck: Supple, thyroid  not palpable. No bruits, nodes or JVD. Respiratory: Respiratory effort normal.  BS equal and clear bilateral without rales, rhonci, wheezing or stridor. Cardio: Heart sounds are normal with regular rate and rhythm and no murmurs, rubs or gallops. Peripheral pulses are normal and equal bilaterally without edema. No aortic or femoral bruits. Chest: symmetric with normal excursions and percussion. Breasts: Symmetric, without lumps, nipple discharge, retractions, or fibrocystic changes.  Abdomen: Flat, soft with bowel sounds active. Nontender, no guarding, rebound, hernias, masses, or organomegaly.  Lymphatics: Non tender without lymphadenopathy.  Genitourinary:  Musculoskeletal: Full  ROM all peripheral extremities, joint stability, 5/5 strength, and normal gait. Skin: Warm and dry without rashes, lesions, cyanosis, clubbing or  ecchymosis.  Neuro: Cranial nerves intact, reflexes equal bilaterally. Normal muscle tone, no cerebellar symptoms. Sensation intact.  Pysch: Alert and oriented x 3, normal affect, Insight and Judgment appropriate.   Assessment and Plan   1. Elevated BP without diagnosis of hypertension  - EKG 12-Lead - Urinalysis, Routine w reflex microscopic - Microalbumin / creatinine urine ratio - CBC with Differential/Platelet - COMPLETE METABOLIC PANEL WITH GFR - Magnesium - TSH  2. Hyperlipidemia, mixed  - EKG 12-Lead - Lipid panel - TSH  3. Abnormal glucose  - EKG 12-Lead - Hemoglobin A1c - Insulin, random  4. Vitamin D deficiency  - VITAMIN D 25 Hydroxy   5. Screening for ischemic heart disease  - EKG 12-Lead  6. FH: heart disease  - EKG 12-Lead  7. Age-related osteoporosis without current pathological fracture  - TSH - VITAMIN D 25 Hydroxy  8. Medication management  - Urinalysis, Routine w reflex microscopic - Microalbumin / creatinine urine ratio - CBC with Differential/Platelet - COMPLETE METABOLIC PANEL WITH GFR - Magnesium - Lipid  panel - TSH - Hemoglobin A1c - Insulin, random - VITAMIN D 25 Hydroxy          Patient was counseled in prudent diet to achieve/maintain BMI less than 25 for weight control, BP monitoring, regular exercise and medications. Discussed med's effects and SE's. Screening labs and tests as requested with regular follow-up as recommended. Over 40 minutes of exam, counseling, chart review and high complex critical decision making was performed.   Kirtland Bouchard, MD

## 2020-06-07 NOTE — Patient Instructions (Signed)

## 2020-06-10 ENCOUNTER — Ambulatory Visit (INDEPENDENT_AMBULATORY_CARE_PROVIDER_SITE_OTHER): Payer: Medicare Other | Admitting: Internal Medicine

## 2020-06-10 ENCOUNTER — Other Ambulatory Visit: Payer: Self-pay

## 2020-06-10 VITALS — BP 104/66 | HR 77 | Temp 97.7°F | Resp 16 | Ht 61.25 in | Wt 119.8 lb

## 2020-06-10 DIAGNOSIS — E782 Mixed hyperlipidemia: Secondary | ICD-10-CM

## 2020-06-10 DIAGNOSIS — Z8249 Family history of ischemic heart disease and other diseases of the circulatory system: Secondary | ICD-10-CM | POA: Diagnosis not present

## 2020-06-10 DIAGNOSIS — M810 Age-related osteoporosis without current pathological fracture: Secondary | ICD-10-CM

## 2020-06-10 DIAGNOSIS — Z79899 Other long term (current) drug therapy: Secondary | ICD-10-CM | POA: Diagnosis not present

## 2020-06-10 DIAGNOSIS — R030 Elevated blood-pressure reading, without diagnosis of hypertension: Secondary | ICD-10-CM | POA: Diagnosis not present

## 2020-06-10 DIAGNOSIS — E559 Vitamin D deficiency, unspecified: Secondary | ICD-10-CM

## 2020-06-10 DIAGNOSIS — Z136 Encounter for screening for cardiovascular disorders: Secondary | ICD-10-CM | POA: Diagnosis not present

## 2020-06-10 DIAGNOSIS — R7309 Other abnormal glucose: Secondary | ICD-10-CM

## 2020-06-11 LAB — URINALYSIS, ROUTINE W REFLEX MICROSCOPIC
Bilirubin Urine: NEGATIVE
Glucose, UA: NEGATIVE
Hgb urine dipstick: NEGATIVE
Ketones, ur: NEGATIVE
Leukocytes,Ua: NEGATIVE
Nitrite: NEGATIVE
Protein, ur: NEGATIVE
Specific Gravity, Urine: 1.004 (ref 1.001–1.03)
pH: 7.5 (ref 5.0–8.0)

## 2020-06-11 LAB — CBC WITH DIFFERENTIAL/PLATELET
Absolute Monocytes: 483 cells/uL (ref 200–950)
Basophils Absolute: 49 cells/uL (ref 0–200)
Basophils Relative: 0.7 %
Eosinophils Absolute: 140 cells/uL (ref 15–500)
Eosinophils Relative: 2 %
HCT: 44.6 % (ref 35.0–45.0)
Hemoglobin: 14.8 g/dL (ref 11.7–15.5)
Lymphs Abs: 2541 cells/uL (ref 850–3900)
MCH: 30.1 pg (ref 27.0–33.0)
MCHC: 33.2 g/dL (ref 32.0–36.0)
MCV: 90.8 fL (ref 80.0–100.0)
MPV: 9.1 fL (ref 7.5–12.5)
Monocytes Relative: 6.9 %
Neutro Abs: 3787 cells/uL (ref 1500–7800)
Neutrophils Relative %: 54.1 %
Platelets: 266 10*3/uL (ref 140–400)
RBC: 4.91 10*6/uL (ref 3.80–5.10)
RDW: 12.6 % (ref 11.0–15.0)
Total Lymphocyte: 36.3 %
WBC: 7 10*3/uL (ref 3.8–10.8)

## 2020-06-11 LAB — MICROALBUMIN / CREATININE URINE RATIO
Creatinine, Urine: 11 mg/dL — ABNORMAL LOW (ref 20–275)
Microalb, Ur: <0.2 mg/dL

## 2020-06-11 LAB — VITAMIN D 25 HYDROXY (VIT D DEFICIENCY, FRACTURES): Vit D, 25-Hydroxy: 98 ng/mL (ref 30–100)

## 2020-06-11 LAB — LIPID PANEL
Cholesterol: 198 mg/dL (ref <200)
HDL: 107 mg/dL (ref >=50)
LDL Cholesterol (Calc): 71 mg/dL (calc)
Non-HDL Cholesterol (Calc): 91 mg/dL (calc) (ref <130)
Total CHOL/HDL Ratio: 1.9 (calc) (ref <5.0)
Triglycerides: 113 mg/dL (ref <150)

## 2020-06-11 LAB — COMPLETE METABOLIC PANEL WITH GFR
AG Ratio: 2.1 (calc) (ref 1.0–2.5)
ALT: 14 U/L (ref 6–29)
AST: 17 U/L (ref 10–35)
Albumin: 4.8 g/dL (ref 3.6–5.1)
Alkaline phosphatase (APISO): 58 U/L (ref 37–153)
BUN: 18 mg/dL (ref 7–25)
CO2: 28 mmol/L (ref 20–32)
Calcium: 10.5 mg/dL — ABNORMAL HIGH (ref 8.6–10.4)
Chloride: 102 mmol/L (ref 98–110)
Creat: 0.85 mg/dL (ref 0.60–0.93)
GFR, Est African American: 80 mL/min/{1.73_m2} (ref >=60)
GFR, Est Non African American: 69 mL/min/{1.73_m2} (ref >=60)
Globulin: 2.3 g/dL (calc) (ref 1.9–3.7)
Glucose, Bld: 92 mg/dL (ref 65–99)
Potassium: 4.7 mmol/L (ref 3.5–5.3)
Sodium: 141 mmol/L (ref 135–146)
Total Bilirubin: 0.5 mg/dL (ref 0.2–1.2)
Total Protein: 7.1 g/dL (ref 6.1–8.1)

## 2020-06-11 LAB — HEMOGLOBIN A1C
Hgb A1c MFr Bld: 5.1 % of total Hgb (ref <5.7)
Mean Plasma Glucose: 100 mg/dL
eAG (mmol/L): 5.5 mmol/L

## 2020-06-11 LAB — MAGNESIUM: Magnesium: 2.1 mg/dL (ref 1.5–2.5)

## 2020-06-11 LAB — TSH: TSH: 3.41 mIU/L (ref 0.40–4.50)

## 2020-06-11 LAB — INSULIN, RANDOM: Insulin: 4.5 u[IU]/mL

## 2020-06-11 NOTE — Progress Notes (Signed)
============================================================ -   Test results slightly outside the reference range are not unusual. If there is anything important, I will review this with you,  otherwise it is considered normal test values.  If you have further questions,  please do not hesitate to contact me at the office or via My Chart.  ============================================================ ============================================================  -  A1c - Normal - Great - No Diabetes ============================================================ ============================================================  -  Vitamin D = 98 - - - >>> Excellent   !  ============================================================ ============================================================  -  Total Chol = 198   ( down from 229 - Great  !  )   - and   - Bad / Dangerous LDL Chol is down from 114 to now 91 -->>  Excellent  !  - and finally you good HDL Chol is even better                                                                   - Up from 85  to now 107 - Jackpot ! ============================================================ ============================================================  -  All Else - CBC - Kidneys - U/A - Electrolytes - Liver - Magnesium & Thyroid    - all  Normal / OK ============================================================ ============================================================

## 2020-07-08 DIAGNOSIS — U071 COVID-19: Secondary | ICD-10-CM | POA: Diagnosis not present

## 2020-07-30 DIAGNOSIS — Z23 Encounter for immunization: Secondary | ICD-10-CM | POA: Diagnosis not present

## 2020-09-11 NOTE — Progress Notes (Signed)
3 MONTH FOLLOWUP  Assessment:   GERD -famotidine before bed and dietary changes  Osteoporosis, unspecified osteoporosis type, unspecified pathological fracture presence -    Continue follow up, Dr Cruzita Lederer -Walking a lot more  Vitamin D deficiency Continue supplement  Elevated BP without diagnosis of hypertension - continue medications, DASH diet, exercise and monitor at home. Call if greater than 130/80.  -     CBC with Differential/Platelet - CMP  Hyperlipidemia, unspecified hyperlipidemia type -continue medications, check lipids, decrease fatty foods, increase activity.  -     Lipid panel  Medication management - magnesium  Over 30 minutes of exam, counseling, chart review and critical decision making was performed Future Appointments  Date Time Provider [place]  06/16/2021 10:00 AM Unk Pinto, MD [facility] None        Subjective:  Sheila Gray is a 70 y.o. female who presents for 3 month follow up    She is cooking a lot, baking a lot and still taking care of a lot of people.   Her blood pressure has been controlled at home, today their BP is BP: 122/70 She does workout. She denies chest pain, shortness of breath, dizziness.   She is seeing Dr. Darnell Level for osteoporosis, doing osteostrong and exercising. Had DEXA 06/2019 and it was stable.   BMI is Body mass index is 22.11 kg/m., she is working on diet and exercise. Wt Readings from Last 3 Encounters:  09/12/20 118 lb (53.5 kg)  06/10/20 119 lb 12.8 oz (54.3 kg)  12/06/19 121 lb (54.9 kg)   She is on cholesterol medication and denies myalgias. Her cholesterol is at goal. The cholesterol last visit was:   Lab Results  Component Value Date   CHOL 198 06/10/2020   HDL 107 06/10/2020   LDLCALC 71 06/10/2020   TRIG 113 06/10/2020   CHOLHDL 1.9 06/10/2020   Last A1C Lab Results  Component Value Date   HGBA1C 5.1 06/10/2020   Last GFR: Lab Results  Component Value Date   GFRNONAA 69 06/10/2020    Patient is on Vitamin D supplement.   Lab Results  Component Value Date   VD25OH 98 06/10/2020      Medication Review: Current Outpatient Medications on File Prior to Visit  Medication Sig Dispense Refill   acetaminophen (TYLENOL) 500 MG tablet Take 500 mg as needed by mouth.     aspirin EC 81 MG tablet Take 81 mg by mouth daily.     Calcium Citrate-Vitamin D (CALCIUM CITRATE + D3 PO) Take 4 tablets daily by mouth.      Cholecalciferol (VITAMIN D PO) Take 5,000 Units by mouth daily.     ezetimibe (ZETIA) 10 MG tablet TAKE 1 TABLET BY MOUTH EVERY DAY 90 tablet 1   Famotidine (PEPCID PO) Take 20 mg by mouth 2 (two) times daily.     fexofenadine (ALLEGRA) 180 MG tablet Take 180 mg by mouth daily.     LUTEIN PO Take 1 capsule by mouth daily.     metroNIDAZOLE (METROCREAM) 0.75 % cream APPLY TO FACE AT BEDTIME  10   Multiple Vitamin (MULTIVITAMIN) tablet Take 1 tablet by mouth daily.     Probiotic Product (PROBIOTIC PO) Take by mouth.     Zinc 25 MG TABS Take by mouth.     Fluticasone-Umeclidin-Vilant (TRELEGY ELLIPTA) 100-62.5-25 MCG/INH AEPB Inhale 1 puff into the lungs daily. (Patient not taking: Reported on 09/12/2020)     OVER THE COUNTER MEDICATION  No current facility-administered medications on file prior to visit.    Allergies  Allergen Reactions   Amoxicillin Rash    Current Problems (verified) Patient Active Problem List   Diagnosis Date Noted   Gastroesophageal reflux disease without esophagitis 09/12/2020   Elevated BP without diagnosis of hypertension 06/19/2015   Hyperlipidemia 06/19/2015   Medication management 06/19/2015   Allergic rhinitis 04/10/2013   Osteoporosis 04/10/2013   Vitamin D deficiency 04/10/2013    Screening Tests Immunization History  Administered Date(s) Administered   Influenza, High Dose Seasonal PF 12/20/2018, 12/25/2019   Influenza,inj,Quad PF,6+ Mos 12/14/2016   Influenza-Unspecified 11/29/2013, 12/09/2015, 12/01/2017    Moderna Sars-Covid-2 Vaccination 04/14/2019, 05/15/2019, 01/05/2020   PPD Test 11/09/2013   Pneumococcal Conjugate-13 03/29/2014   Pneumococcal Polysaccharide-23 01/09/2016   Td 05/14/2005, 12/12/2015   Zoster, Live 11/28/2009    Preventative care: Last colonoscopy: 06/2019- one polyp  Flex Sig due 2022 Last mammogram: 10/18/19 benign Last pap smear/pelvic exam: never abnormal pap 2017 DEXA: 06/2019 following Dr. Darnell Level -3.2 did not get worse.    SURGICAL HISTORY She  has no past surgical history on file. FAMILY HISTORY Her family history includes Heart disease in her father; Hypertension in her mother; Kidney disease in her father. SOCIAL HISTORY She  reports that she has never smoked. She has never used smokeless tobacco. She reports current alcohol use of about 3.0 standard drinks of alcohol per week. She reports that she does not use drugs.    Review of Systems  Constitutional: Negative.  Negative for chills, fever, malaise/fatigue and weight loss.  HENT: Negative.  Negative for congestion, ear discharge, hearing loss, sore throat and tinnitus.   Eyes: Negative.  Negative for blurred vision, discharge and redness.  Respiratory:  Positive for cough. Negative for hemoptysis, sputum production, shortness of breath and wheezing.   Cardiovascular: Negative.  Negative for chest pain, palpitations, orthopnea and leg swelling.  Gastrointestinal:  Positive for heartburn (controlled with diet and famotidine at bedtime). Negative for abdominal pain, blood in stool, constipation, diarrhea, nausea and vomiting.  Genitourinary: Negative.  Negative for dysuria, frequency and urgency.  Musculoskeletal: Negative.  Negative for back pain, falls, joint pain and myalgias.  Skin: Negative.  Negative for rash.  Neurological: Negative.  Negative for dizziness, tingling, seizures, weakness and headaches.  Endo/Heme/Allergies: Negative.  Does not bruise/bleed easily.  Psychiatric/Behavioral: Negative.   Negative for depression, hallucinations, memory loss and suicidal ideas. The patient does not have insomnia.     Objective:     Today's Vitals   09/12/20 1051  BP: 122/70  Pulse: 65  Temp: (!) 96.8 F (36 C)  SpO2: 96%  Weight: 118 lb (53.5 kg)   Body mass index is 22.11 kg/m.  General appearance: alert, no distress, WD/WN, female HEENT: normocephalic, sclerae anicteric, TMs pearly, nares patent, no discharge or erythema, pharynx normal Oral cavity: MMM, no lesions Neck: supple, no lymphadenopathy, no thyromegaly, no masses Heart: RRR, normal S1, S2, no murmurs Lungs: CTA bilaterally, no wheezes, rhonchi, or rales Abdomen: +bs, soft, non tender, non distended, no masses, no hepatomegaly, no splenomegaly Musculoskeletal: nontender, no swelling, no obvious deformity Extremities: no edema, no cyanosis, no clubbing Pulses: 2+ symmetric, upper and lower extremities, normal cap refill Neurological: alert, oriented x 3, CN2-12 intact, strength normal upper extremities and lower extremities, sensation normal throughout, DTRs 2+ throughout, no cerebellar signs, gait normal Psychiatric: normal affect, behavior normal, pleasant        Magda Bernheim ANP-C  [facility]  09/12/2020

## 2020-09-12 ENCOUNTER — Encounter: Payer: Self-pay | Admitting: Nurse Practitioner

## 2020-09-12 ENCOUNTER — Ambulatory Visit (INDEPENDENT_AMBULATORY_CARE_PROVIDER_SITE_OTHER): Payer: Medicare Other | Admitting: Nurse Practitioner

## 2020-09-12 ENCOUNTER — Other Ambulatory Visit: Payer: Self-pay

## 2020-09-12 VITALS — BP 122/70 | HR 65 | Temp 96.8°F | Wt 118.0 lb

## 2020-09-12 DIAGNOSIS — R030 Elevated blood-pressure reading, without diagnosis of hypertension: Secondary | ICD-10-CM | POA: Diagnosis not present

## 2020-09-12 DIAGNOSIS — M810 Age-related osteoporosis without current pathological fracture: Secondary | ICD-10-CM | POA: Diagnosis not present

## 2020-09-12 DIAGNOSIS — Z79899 Other long term (current) drug therapy: Secondary | ICD-10-CM

## 2020-09-12 DIAGNOSIS — K219 Gastro-esophageal reflux disease without esophagitis: Secondary | ICD-10-CM | POA: Diagnosis not present

## 2020-09-12 DIAGNOSIS — E559 Vitamin D deficiency, unspecified: Secondary | ICD-10-CM | POA: Diagnosis not present

## 2020-09-12 DIAGNOSIS — E785 Hyperlipidemia, unspecified: Secondary | ICD-10-CM

## 2020-09-13 LAB — CBC WITH DIFFERENTIAL/PLATELET
Absolute Monocytes: 462 cells/uL (ref 200–950)
Basophils Absolute: 39 cells/uL (ref 0–200)
Basophils Relative: 0.5 %
Eosinophils Absolute: 77 cells/uL (ref 15–500)
Eosinophils Relative: 1 %
HCT: 45.4 % — ABNORMAL HIGH (ref 35.0–45.0)
Hemoglobin: 15.1 g/dL (ref 11.7–15.5)
Lymphs Abs: 2510 cells/uL (ref 850–3900)
MCH: 30.3 pg (ref 27.0–33.0)
MCHC: 33.3 g/dL (ref 32.0–36.0)
MCV: 91.2 fL (ref 80.0–100.0)
MPV: 9.4 fL (ref 7.5–12.5)
Monocytes Relative: 6 %
Neutro Abs: 4612 cells/uL (ref 1500–7800)
Neutrophils Relative %: 59.9 %
Platelets: 254 10*3/uL (ref 140–400)
RBC: 4.98 10*6/uL (ref 3.80–5.10)
RDW: 12.7 % (ref 11.0–15.0)
Total Lymphocyte: 32.6 %
WBC: 7.7 10*3/uL (ref 3.8–10.8)

## 2020-09-13 LAB — COMPLETE METABOLIC PANEL WITH GFR
AG Ratio: 1.9 (calc) (ref 1.0–2.5)
ALT: 15 U/L (ref 6–29)
AST: 18 U/L (ref 10–35)
Albumin: 4.6 g/dL (ref 3.6–5.1)
Alkaline phosphatase (APISO): 58 U/L (ref 37–153)
BUN: 17 mg/dL (ref 7–25)
CO2: 31 mmol/L (ref 20–32)
Calcium: 10.5 mg/dL — ABNORMAL HIGH (ref 8.6–10.4)
Chloride: 101 mmol/L (ref 98–110)
Creat: 0.82 mg/dL (ref 0.60–0.93)
GFR, Est African American: 83 mL/min/{1.73_m2} (ref >=60)
GFR, Est Non African American: 72 mL/min/{1.73_m2} (ref >=60)
Globulin: 2.4 g/dL (calc) (ref 1.9–3.7)
Glucose, Bld: 82 mg/dL (ref 65–99)
Potassium: 4.3 mmol/L (ref 3.5–5.3)
Sodium: 139 mmol/L (ref 135–146)
Total Bilirubin: 0.5 mg/dL (ref 0.2–1.2)
Total Protein: 7 g/dL (ref 6.1–8.1)

## 2020-09-13 LAB — LIPID PANEL
Cholesterol: 199 mg/dL (ref <200)
HDL: 85 mg/dL (ref >=50)
LDL Cholesterol (Calc): 89 mg/dL (calc)
Non-HDL Cholesterol (Calc): 114 mg/dL (calc) (ref <130)
Total CHOL/HDL Ratio: 2.3 (calc) (ref <5.0)
Triglycerides: 145 mg/dL (ref <150)

## 2020-09-13 LAB — MAGNESIUM: Magnesium: 2.1 mg/dL (ref 1.5–2.5)

## 2020-09-13 LAB — VITAMIN D 25 HYDROXY (VIT D DEFICIENCY, FRACTURES): Vit D, 25-Hydroxy: 89 ng/mL (ref 30–100)

## 2020-10-28 DIAGNOSIS — U071 COVID-19: Secondary | ICD-10-CM | POA: Diagnosis not present

## 2020-11-22 DIAGNOSIS — Z1231 Encounter for screening mammogram for malignant neoplasm of breast: Secondary | ICD-10-CM | POA: Diagnosis not present

## 2020-11-22 LAB — HM MAMMOGRAPHY

## 2020-12-05 ENCOUNTER — Telehealth: Payer: Self-pay | Admitting: Internal Medicine

## 2020-12-05 NOTE — Telephone Encounter (Signed)
I am happy to have her establish care.  Thanks

## 2020-12-05 NOTE — Telephone Encounter (Signed)
Good afternoon Dr. Henrene Pastor, patient called requesting to establish care with you.  Last colon was in 2019.  Will send records to you for review.  Can you advise on scheduling?  Thank you.

## 2020-12-06 NOTE — Telephone Encounter (Signed)
Called patient to schedule appt.  Patient stated will call back at the end of December to schedule for next year.

## 2020-12-23 DIAGNOSIS — Z23 Encounter for immunization: Secondary | ICD-10-CM | POA: Diagnosis not present

## 2020-12-24 ENCOUNTER — Encounter: Payer: Self-pay | Admitting: Internal Medicine

## 2021-01-08 NOTE — Progress Notes (Deleted)
MEDICARE ANNUAL WELLNESS VISIT AND CPE  Assessment:    Seasonal allergic rhinitis due to pollen Continue OTC allergy pills Get on nasal spray  Osteoporosis, unspecified osteoporosis type, unspecified pathological fracture presence -    Continue follow up  Vitamin D deficiency Continue supplement  Elevated BP without diagnosis of hypertension - continue medications, DASH diet, exercise and monitor at home. Call if greater than 130/80.  -     CBC with Differential/Platelet -     BASIC METABOLIC PANEL WITH GFR -     Hepatic function panel -     TSH  Hyperlipidemia, unspecified hyperlipidemia type -continue medications, check lipids, decrease fatty foods, increase activity.  -     Lipid panel  Medication management -     Magnesium  Encounter for Medicare annual wellness exam 1 year  Over 30 minutes of exam, counseling, chart review and critical decision making was performed Future Appointments  Date Time Provider [place]  01/13/2021 10:30 AM Magda Bernheim, NP [facility] None  06/16/2021 10:00 AM Unk Pinto, MD [facility] None     Plan:   During the course of the visit the patient was educated and counseled about appropriate screening and preventive services including:   Pneumococcal vaccine  Prevnar 13 Influenza vaccine Td vaccine Screening electrocardiogram Bone densitometry screening Colorectal cancer screening Diabetes screening Glaucoma screening Nutrition counseling  Advanced directives: requested   Subjective:  Sheila Gray is a 70 y.o. female who presents for Medicare Annual Wellness Visit and complete physical.    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   Her blood pressure has been controlled at home, today their BP is   She does workout. She denies chest pain, shortness of breath, dizziness.   She is seeing Dr. Darnell Level for osteoporosis, doing osteostrong and exercising. Had DEXA 06/2019 and it was  stable.   BMI is There is no height or weight on file to calculate BMI., she is working on diet and exercise. Wt Readings from Last 3 Encounters:  09/12/20 118 lb (53.5 kg)  06/10/20 119 lb 12.8 oz (54.3 kg)  12/06/19 121 lb (54.9 kg)   She is on cholesterol medication and denies myalgias. Her cholesterol is at goal. The cholesterol last visit was:   Lab Results  Component Value Date   CHOL 199 09/12/2020   HDL 85 09/12/2020   LDLCALC 89 09/12/2020   TRIG 145 09/12/2020   CHOLHDL 2.3 09/12/2020   Last A1C Lab Results  Component Value Date   HGBA1C 5.1 06/10/2020   Last GFR: Lab Results  Component Value Date   GFRNONAA 72 09/12/2020   Patient is on Vitamin D supplement.   Lab Results  Component Value Date   VD25OH 89 09/12/2020      Medication Review: Current Outpatient Medications on File Prior to Visit  Medication Sig Dispense Refill   acetaminophen (TYLENOL) 500 MG tablet Take 500 mg as needed by mouth.     aspirin EC 81 MG tablet Take 81 mg by mouth daily.     Calcium Citrate-Vitamin D (CALCIUM CITRATE + D3 PO) Take 4 tablets daily by mouth.      Cholecalciferol (VITAMIN D PO) Take 5,000 Units by mouth daily.     ezetimibe (ZETIA) 10 MG tablet TAKE 1 TABLET BY MOUTH EVERY DAY 90 tablet 1   Famotidine (PEPCID PO) Take 20 mg by mouth 2 (two) times daily.  fexofenadine (ALLEGRA) 180 MG tablet Take 180 mg by mouth daily.     LUTEIN PO Take 1 capsule by mouth daily.     metroNIDAZOLE (METROCREAM) 0.75 % cream APPLY TO FACE AT BEDTIME  10   Multiple Vitamin (MULTIVITAMIN) tablet Take 1 tablet by mouth daily.     Probiotic Product (PROBIOTIC PO) Take by mouth.     Zinc 25 MG TABS Take by mouth.     No current facility-administered medications on file prior to visit.    Allergies  Allergen Reactions   Amoxicillin Rash    Current Problems (verified) Patient Active Problem List   Diagnosis Date Noted   Gastroesophageal reflux disease without esophagitis  09/12/2020   Elevated BP without diagnosis of hypertension 06/19/2015   Hyperlipidemia 06/19/2015   Medication management 06/19/2015   Allergic rhinitis 04/10/2013   Osteoporosis 04/10/2013   Vitamin D deficiency 04/10/2013    Screening Tests Immunization History  Administered Date(s) Administered   Influenza, High Dose Seasonal PF 12/20/2018, 12/25/2019   Influenza,inj,Quad PF,6+ Mos 12/14/2016   Influenza-Unspecified 11/29/2013, 12/09/2015, 12/01/2017   Moderna Sars-Covid-2 Vaccination 04/14/2019, 05/15/2019, 01/05/2020   PPD Test 11/09/2013   Pneumococcal Conjugate-13 03/29/2014   Pneumococcal Polysaccharide-23 01/09/2016   Td 05/14/2005, 12/12/2015   Zoster, Live 11/28/2009   Health Maintenance  Topic Date Due   Zoster Vaccines- Shingrix (1 of 2) Never done   COVID-19 Vaccine (6 - Booster) 03/01/2020   INFLUENZA VACCINE  10/14/2020   DEXA SCAN  06/14/2021   COLONOSCOPY (Pts 45-80yrs Insurance coverage will need to be confirmed)  06/25/2021   MAMMOGRAM  11/22/2021   TETANUS/TDAP  12/11/2025   Pneumonia Vaccine 61+ Years old  Completed   Hepatitis C Screening  Completed   HPV VACCINES  Aged Out   Preventative care: Last colonoscopy: 10/2017- Dr. Cristina Gong due in 2023  Flex Sig 06/26/2019 getting one next time Last mammogram: 09/2018- has appointment in August Last pap smear/pelvic exam: never abnormal pap 2017 DEXA: 06/2019 following Dr. Darnell Level -3.2 did not get worse.   Names of Other Physician/Practitioners you currently use: 1. [facility] here for primary care 2. [place], eye doctor, last visit 2020 3. Shanon Brow, dentist, last visit appt 2020 Patient Care Team: Unk Pinto, MD as PCP - General (Internal Medicine) Inda Castle, MD (Inactive) as Consulting Physician (Gastroenterology) Love, Alyson Locket, MD as Consulting Physician (Neurology) [place] Ander Slade, MD (Rheumatology) Lavonna Monarch, MD as Consulting Physician  (Dermatology) Garald Balding, MD as Consulting Physician (Orthopedic Surgery) Gus Height, MD (Inactive) as Consulting Physician (Obstetrics and Gynecology)  SURGICAL HISTORY She  has no past surgical history on file. FAMILY HISTORY Her family history includes Heart disease in her father; Hypertension in her mother; Kidney disease in her father. SOCIAL HISTORY She  reports that she has never smoked. She has never used smokeless tobacco. She reports current alcohol use of about 3.0 standard drinks per week. She reports that she does not use drugs.   MEDICARE WELLNESS OBJECTIVES: Physical activity:   Cardiac risk factors:   Depression/mood screen:   Depression screen [facility] 2/9 06/07/2020  Decreased Interest 0  Down, Depressed, Hopeless 0  PHQ - 2 Score 0    ADLs:  In your present state of health, do you have any difficulty performing the following activities: 06/07/2020  Hearing? N  Vision? N  Difficulty concentrating or making decisions? N  Walking or climbing stairs? N  Dressing or bathing? N  Doing errands, shopping? N  Some recent data might be hidden     Cognitive Testing  Alert? Yes  Normal Appearance?Yes  Oriented to person? Yes  Place? Yes   Time? Yes  Recall of three objects?  Yes  Can perform simple calculations? Yes  Displays appropriate judgment?Yes  Can read the correct time from a watch face?Yes  EOL planning:    Review of Systems  Constitutional: Negative.  Negative for chills, fever and weight loss.  HENT: Negative.  Negative for congestion and hearing loss.   Eyes: Negative.  Negative for blurred vision and double vision.  Respiratory:  Positive for cough. Negative for hemoptysis, sputum production, shortness of breath and wheezing.   Cardiovascular: Negative.  Negative for chest pain, palpitations, orthopnea and leg swelling.  Gastrointestinal: Negative.  Negative for abdominal pain, constipation, diarrhea, heartburn, nausea and vomiting.   Genitourinary: Negative.   Musculoskeletal: Negative.  Negative for falls, joint pain and myalgias.  Skin: Negative.  Negative for rash.  Neurological: Negative.  Negative for dizziness, tingling, tremors, loss of consciousness and headaches.  Endo/Heme/Allergies: Negative.   Psychiatric/Behavioral: Negative.  Negative for depression, memory loss and suicidal ideas.     Objective:     There were no vitals filed for this visit.  There is no height or weight on file to calculate BMI.  General appearance: alert, no distress, WD/WN, female HEENT: normocephalic, sclerae anicteric, TMs pearly, nares patent, no discharge or erythema, pharynx normal Oral cavity: MMM, no lesions Neck: supple, no lymphadenopathy, no thyromegaly, no masses Heart: RRR, normal S1, S2, no murmurs Lungs: CTA bilaterally, no wheezes, rhonchi, or rales Abdomen: +bs, soft, non tender, non distended, no masses, no hepatomegaly, no splenomegaly Musculoskeletal: nontender, no swelling, no obvious deformity Extremities: no edema, no cyanosis, no clubbing Pulses: 2+ symmetric, upper and lower extremities, normal cap refill Neurological: alert, oriented x 3, CN2-12 intact, strength normal upper extremities and lower extremities, sensation normal throughout, DTRs 2+ throughout, no cerebellar signs, gait normal Psychiatric: normal affect, behavior normal, pleasant    Medicare Attestation I have personally reviewed: The patient's medical and social history Their use of alcohol, tobacco or illicit drugs Their current medications and supplements The patient's functional ability including ADLs,fall risks, home safety risks, cognitive, and hearing and visual impairment Diet and physical activities Evidence for depression or mood disorders  The patient's weight, height, BMI, and visual acuity have been recorded in the chart.  I have made referrals, counseling, and provided education to the patient based on review of the above  and I have provided the patient with a written personalized care plan for preventive services.     Magda Bernheim, NP   01/08/2021

## 2021-01-13 ENCOUNTER — Ambulatory Visit: Payer: Medicare Other | Admitting: Nurse Practitioner

## 2021-01-27 NOTE — Progress Notes (Signed)
MEDICARE ANNUAL WELLNESS VISIT AND CPE  Assessment:  Encounter for Medicare annual wellness exam  Due Annually  Health Maintenance Reviewed  Healthy lifestyle reviewed and goals set   Seasonal allergic rhinitis due to pollen Continue OTC allergy pills Get on nasal spray  Osteoporosis, unspecified osteoporosis type, unspecified pathological fracture presence -    Continue follow up, calcium supplementation and weight bearing exercises  Vitamin D deficiency Continue supplement  Elevated BP without diagnosis of hypertension - continue medications, DASH diet, exercise and monitor at home. Call if greater than 130/80.  -     CBC with Differential/Platelet -     CMP -     TSH  Hyperlipidemia, unspecified hyperlipidemia type -continue medications, check lipids, decrease fatty foods, increase activity.  -     Lipid panel  Medication management Continued  Abnormal Glucose A1c Continue diet and exercise    Over 30 minutes of exam, counseling, chart review and critical decision making was performed Future Appointments  Date Time Provider [place]  06/16/2021 11:00 AM Unk Pinto, MD [facility] None  01/29/2022 11:00 AM Magda Bernheim, NP [facility] None     Plan:   During the course of the visit the patient was educated and counseled about appropriate screening and preventive services including:   Pneumococcal vaccine  Prevnar 13 Influenza vaccine Td vaccine Screening electrocardiogram Bone densitometry screening Colorectal cancer screening Diabetes screening Glaucoma screening Nutrition counseling  Advanced directives: requested   Subjective:  Sheila Gray is a 70 y.o. female who presents for Medicare Annual Wellness Visit and complete physical.    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   Her blood pressure has been controlled at home, today their BP is BP: 110/70 BP Readings from Last 3 Encounters:  01/29/21  110/70  09/12/20 122/70  06/10/20 104/66    She does workout. She denies chest pain, shortness of breath, dizziness.   She is seeing Dr. Darnell Level for osteoporosis, doing osteostrong and exercising. Had DEXA 06/2019 and it was stable.   BMI is Body mass index is 21.66 kg/m., she is working on diet and exercise. Has stopped making warm bread, continues to exercise.  Wt Readings from Last 3 Encounters:  01/29/21 115 lb 9.6 oz (52.4 kg)  09/12/20 118 lb (53.5 kg)  06/10/20 119 lb 12.8 oz (54.3 kg)   She is on cholesterol medication , Zetia and denies myalgias. Her cholesterol is at goal. The cholesterol last visit was:   Lab Results  Component Value Date   CHOL 199 09/12/2020   HDL 85 09/12/2020   LDLCALC 89 09/12/2020   TRIG 145 09/12/2020   CHOLHDL 2.3 09/12/2020   Last A1C Lab Results  Component Value Date   HGBA1C 5.1 06/10/2020   Last GFR: Lab Results  Component Value Date   GFRNONAA 72 09/12/2020   Patient is on Vitamin D supplement.   Lab Results  Component Value Date   VD25OH 89 09/12/2020      Medication Review: Current Outpatient Medications on File Prior to Visit  Medication Sig Dispense Refill   acetaminophen (TYLENOL) 500 MG tablet Take 500 mg as needed by mouth.     aspirin EC 81 MG tablet Take 81 mg by mouth daily.     Calcium Citrate-Vitamin D (CALCIUM CITRATE + D3 PO) Take 4 tablets daily by mouth.      Cholecalciferol (VITAMIN D PO) Take 5,000 Units by mouth daily.  ezetimibe (ZETIA) 10 MG tablet TAKE 1 TABLET BY MOUTH EVERY DAY 90 tablet 1   Famotidine (PEPCID PO) Take 20 mg by mouth 2 (two) times daily.     fexofenadine (ALLEGRA) 180 MG tablet Take 180 mg by mouth daily.     LUTEIN PO Take 1 capsule by mouth daily.     Multiple Vitamin (MULTIVITAMIN) tablet Take 1 tablet by mouth daily.     Probiotic Product (PROBIOTIC PO) Take by mouth.     Zinc 25 MG TABS Take by mouth.     metroNIDAZOLE (METROCREAM) 0.75 % cream APPLY TO FACE AT BEDTIME  10   No  current facility-administered medications on file prior to visit.    Allergies  Allergen Reactions   Amoxicillin Rash    Current Problems (verified) Patient Active Problem List   Diagnosis Date Noted   Gastroesophageal reflux disease without esophagitis 09/12/2020   Elevated BP without diagnosis of hypertension 06/19/2015   Hyperlipidemia 06/19/2015   Medication management 06/19/2015   Allergic rhinitis 04/10/2013   Osteoporosis 04/10/2013   Vitamin D deficiency 04/10/2013    Screening Tests Immunization History  Administered Date(s) Administered   Influenza, High Dose Seasonal PF 12/20/2018, 12/25/2019   Influenza,inj,Quad PF,6+ Mos 12/14/2016   Influenza-Unspecified 11/29/2013, 12/09/2015, 12/01/2017   Moderna Sars-Covid-2 Vaccination 04/14/2019, 05/15/2019, 01/05/2020   PPD Test 11/09/2013   Pneumococcal Conjugate-13 03/29/2014   Pneumococcal Polysaccharide-23 01/09/2016   Td 05/14/2005, 12/12/2015   Zoster, Live 11/28/2009   Health Maintenance  Topic Date Due   Zoster Vaccines- Shingrix (1 of 2) Never done   COVID-19 Vaccine (6 - Booster) 03/01/2020   INFLUENZA VACCINE  10/14/2020   DEXA SCAN  06/14/2021   COLONOSCOPY (Pts 45-52yrs Insurance coverage will need to be confirmed)  06/25/2021   MAMMOGRAM  11/22/2021   TETANUS/TDAP  12/11/2025   Pneumonia Vaccine 83+ Years old  Completed   Hepatitis C Screening  Completed   HPV VACCINES  Aged Out   Preventative care: Last colonoscopy: 10/2017- Dr. Cristina Gong due in 2023  Flex Sig 06/26/2019 getting one next time Last mammogram: 11/22/20 benign repeat 1 year Last pap smear/pelvic exam: never abnormal pap 2017 DEXA: 06/2019 following Dr. Darnell Level -3.2 did not get worse.   Names of Other Physician/Practitioners you currently use: 1. [facility] here for primary care 2. [facility], eye doctor, last visit 2021 3. Shanon Brow, dentist, last visit appt 2020 Patient Care Team: Unk Pinto, MD as  PCP - General (Internal Medicine) Inda Castle, MD (Inactive) as Consulting Physician (Gastroenterology) Love, Alyson Locket, MD as Consulting Physician (Neurology) [place] Ander Slade, MD (Rheumatology) Lavonna Monarch, MD as Consulting Physician (Dermatology) Garald Balding, MD as Consulting Physician (Orthopedic Surgery) Gus Height, MD (Inactive) as Consulting Physician (Obstetrics and Gynecology)  SURGICAL HISTORY She  has no past surgical history on file. FAMILY HISTORY Her family history includes Heart disease in her father; Hypertension in her mother; Kidney disease in her father. SOCIAL HISTORY She  reports that she has never smoked. She has never used smokeless tobacco. She reports current alcohol use of about 3.0 standard drinks per week. She reports that she does not use drugs.   MEDICARE WELLNESS OBJECTIVES: Physical activity: Current Exercise Habits: Home exercise routine, Type of exercise: walking, Time (Minutes): 60, Frequency (Times/Week): 7, Weekly Exercise (Minutes/Week): 420, Intensity: Mild, Exercise limited by: None identified Cardiac risk factors: Cardiac Risk Factors include: advanced age (>73men, >48 women);hypertension Depression/mood screen:   Depression screen [facility] 2/9 01/29/2021  Decreased Interest 0  Down, Depressed, Hopeless 0  PHQ - 2 Score 0    ADLs:  In your present state of health, do you have any difficulty performing the following activities: 01/29/2021 06/07/2020  Hearing? N N  Vision? N N  Difficulty concentrating or making decisions? N N  Walking or climbing stairs? N N  Dressing or bathing? N N  Doing errands, shopping? N N  Some recent data might be hidden     Cognitive Testing  Alert? Yes  Normal Appearance?Yes  Oriented to person? Yes  Place? Yes   Time? Yes  Recall of three objects?  Yes  Can perform simple calculations? Yes  Displays appropriate judgment?Yes  Can read the correct time from a watch face?Yes  EOL planning: Does  Patient Have a Medical Advance Directive?: Yes Type of Advance Directive: Healthcare Power of Attorney, Living will Does patient want to make changes to medical advance directive?: No - Patient declined Copy of [place] in Chart?: No - copy requested  Review of Systems  Constitutional: Negative.  Negative for chills, fever and weight loss.  HENT: Negative.  Negative for congestion and hearing loss.   Eyes: Negative.  Negative for blurred vision and double vision.  Respiratory:  Negative for cough, hemoptysis, sputum production, shortness of breath and wheezing.   Cardiovascular: Negative.  Negative for chest pain, palpitations, orthopnea and leg swelling.  Gastrointestinal: Negative.  Negative for abdominal pain, constipation, diarrhea, heartburn, nausea and vomiting.  Genitourinary: Negative.   Musculoskeletal: Negative.  Negative for falls, joint pain and myalgias.  Skin: Negative.  Negative for rash.  Neurological: Negative.  Negative for dizziness, tingling, tremors, loss of consciousness and headaches.  Endo/Heme/Allergies: Negative.   Psychiatric/Behavioral: Negative.  Negative for depression, memory loss and suicidal ideas.     Objective:     Today's Vitals   01/29/21 1104  BP: 110/70  Pulse: 75  Resp: 16  Temp: 97.9 F (36.6 C)  SpO2: 96%  Weight: 115 lb 9.6 oz (52.4 kg)  Height: 5' 1.25" (1.556 m)   Body mass index is 21.66 kg/m.  General appearance: alert, no distress, WD/WN, female HEENT: normocephalic, sclerae anicteric, TMs pearly, nares patent, no discharge or erythema, pharynx normal Oral cavity: MMM, no lesions Neck: supple, no lymphadenopathy, no thyromegaly, no masses Heart: RRR, normal S1, S2, no murmurs Lungs: CTA bilaterally, no wheezes, rhonchi, or rales Abdomen: +bs, soft, non tender, non distended, no masses, no hepatomegaly, no splenomegaly Musculoskeletal: nontender, no swelling, no obvious deformity Extremities: no edema, no  cyanosis, no clubbing Pulses: 2+ symmetric, upper and lower extremities, normal cap refill Neurological: alert, oriented x 3, CN2-12 intact, strength normal upper extremities and lower extremities, sensation normal throughout, DTRs 2+ throughout, no cerebellar signs, gait normal Psychiatric: normal affect, behavior normal, pleasant    Medicare Attestation I have personally reviewed: The patient's medical and social history Their use of alcohol, tobacco or illicit drugs Their current medications and supplements The patient's functional ability including ADLs,fall risks, home safety risks, cognitive, and hearing and visual impairment Diet and physical activities Evidence for depression or mood disorders  The patient's weight, height, BMI, and visual acuity have been recorded in the chart.  I have made referrals, counseling, and provided education to the patient based on review of the above and I have provided the patient with a written personalized care plan for preventive services.     Magda Bernheim, NP   01/29/2021

## 2021-01-29 ENCOUNTER — Encounter: Payer: Self-pay | Admitting: Nurse Practitioner

## 2021-01-29 ENCOUNTER — Ambulatory Visit (INDEPENDENT_AMBULATORY_CARE_PROVIDER_SITE_OTHER): Payer: Medicare Other | Admitting: Nurse Practitioner

## 2021-01-29 ENCOUNTER — Other Ambulatory Visit: Payer: Self-pay

## 2021-01-29 VITALS — BP 110/70 | HR 75 | Temp 97.9°F | Resp 16 | Ht 61.25 in | Wt 115.6 lb

## 2021-01-29 DIAGNOSIS — R6889 Other general symptoms and signs: Secondary | ICD-10-CM

## 2021-01-29 DIAGNOSIS — K219 Gastro-esophageal reflux disease without esophagitis: Secondary | ICD-10-CM

## 2021-01-29 DIAGNOSIS — M810 Age-related osteoporosis without current pathological fracture: Secondary | ICD-10-CM

## 2021-01-29 DIAGNOSIS — E785 Hyperlipidemia, unspecified: Secondary | ICD-10-CM | POA: Diagnosis not present

## 2021-01-29 DIAGNOSIS — Z0001 Encounter for general adult medical examination with abnormal findings: Secondary | ICD-10-CM

## 2021-01-29 DIAGNOSIS — Z0000 Encounter for general adult medical examination without abnormal findings: Secondary | ICD-10-CM

## 2021-01-29 DIAGNOSIS — R030 Elevated blood-pressure reading, without diagnosis of hypertension: Secondary | ICD-10-CM

## 2021-01-29 DIAGNOSIS — E559 Vitamin D deficiency, unspecified: Secondary | ICD-10-CM

## 2021-01-29 DIAGNOSIS — R7309 Other abnormal glucose: Secondary | ICD-10-CM

## 2021-01-29 DIAGNOSIS — J301 Allergic rhinitis due to pollen: Secondary | ICD-10-CM

## 2021-01-29 DIAGNOSIS — Z79899 Other long term (current) drug therapy: Secondary | ICD-10-CM

## 2021-01-29 DIAGNOSIS — I10 Essential (primary) hypertension: Secondary | ICD-10-CM

## 2021-01-29 NOTE — Patient Instructions (Signed)

## 2021-01-30 LAB — CBC WITH DIFFERENTIAL/PLATELET
Absolute Monocytes: 450 cells/uL (ref 200–950)
Basophils Absolute: 38 cells/uL (ref 0–200)
Basophils Relative: 0.5 %
Eosinophils Absolute: 98 cells/uL (ref 15–500)
Eosinophils Relative: 1.3 %
HCT: 44.1 % (ref 35.0–45.0)
Hemoglobin: 15 g/dL (ref 11.7–15.5)
Lymphs Abs: 2385 cells/uL (ref 850–3900)
MCH: 31.3 pg (ref 27.0–33.0)
MCHC: 34 g/dL (ref 32.0–36.0)
MCV: 91.9 fL (ref 80.0–100.0)
MPV: 9 fL (ref 7.5–12.5)
Monocytes Relative: 6 %
Neutro Abs: 4530 cells/uL (ref 1500–7800)
Neutrophils Relative %: 60.4 %
Platelets: 268 10*3/uL (ref 140–400)
RBC: 4.8 10*6/uL (ref 3.80–5.10)
RDW: 12.5 % (ref 11.0–15.0)
Total Lymphocyte: 31.8 %
WBC: 7.5 10*3/uL (ref 3.8–10.8)

## 2021-01-30 LAB — COMPLETE METABOLIC PANEL WITH GFR
AG Ratio: 1.8 (calc) (ref 1.0–2.5)
ALT: 12 U/L (ref 6–29)
AST: 17 U/L (ref 10–35)
Albumin: 4.5 g/dL (ref 3.6–5.1)
Alkaline phosphatase (APISO): 50 U/L (ref 37–153)
BUN: 16 mg/dL (ref 7–25)
CO2: 30 mmol/L (ref 20–32)
Calcium: 10.2 mg/dL (ref 8.6–10.4)
Chloride: 102 mmol/L (ref 98–110)
Creat: 0.76 mg/dL (ref 0.60–1.00)
Globulin: 2.5 g/dL (calc) (ref 1.9–3.7)
Glucose, Bld: 83 mg/dL (ref 65–99)
Potassium: 4.3 mmol/L (ref 3.5–5.3)
Sodium: 139 mmol/L (ref 135–146)
Total Bilirubin: 0.5 mg/dL (ref 0.2–1.2)
Total Protein: 7 g/dL (ref 6.1–8.1)
eGFR: 84 mL/min/{1.73_m2} (ref >=60)

## 2021-01-30 LAB — LIPID PANEL
Cholesterol: 205 mg/dL — ABNORMAL HIGH (ref <200)
HDL: 99 mg/dL (ref >=50)
LDL Cholesterol (Calc): 81 mg/dL (calc)
Non-HDL Cholesterol (Calc): 106 mg/dL (calc) (ref <130)
Total CHOL/HDL Ratio: 2.1 (calc) (ref <5.0)
Triglycerides: 153 mg/dL — ABNORMAL HIGH (ref <150)

## 2021-01-30 LAB — HEMOGLOBIN A1C
Hgb A1c MFr Bld: 5.2 % of total Hgb (ref <5.7)
Mean Plasma Glucose: 103 mg/dL
eAG (mmol/L): 5.7 mmol/L

## 2021-01-30 LAB — TSH: TSH: 2.4 mIU/L (ref 0.40–4.50)

## 2021-03-24 ENCOUNTER — Encounter: Payer: Self-pay | Admitting: Internal Medicine

## 2021-04-01 DIAGNOSIS — U071 COVID-19: Secondary | ICD-10-CM | POA: Diagnosis not present

## 2021-05-02 ENCOUNTER — Other Ambulatory Visit: Payer: Self-pay

## 2021-05-02 ENCOUNTER — Ambulatory Visit (AMBULATORY_SURGERY_CENTER): Payer: Self-pay

## 2021-05-02 VITALS — Ht 62.0 in | Wt 115.0 lb

## 2021-05-02 DIAGNOSIS — Z86010 Personal history of colonic polyps: Secondary | ICD-10-CM

## 2021-05-02 MED ORDER — PLENVU 140 G PO SOLR: 1.0000 | Freq: Once | ORAL | 0 refills | Status: AC

## 2021-05-02 NOTE — Progress Notes (Signed)
Denies allergies to eggs or soy products. Denies complication of anesthesia or sedation. Denies use of weight loss medication. Denies use of O2.   Emmi instructions given for colonoscopy.  

## 2021-05-12 ENCOUNTER — Telehealth: Payer: Self-pay | Admitting: Internal Medicine

## 2021-05-12 DIAGNOSIS — Z86010 Personal history of colonic polyps: Secondary | ICD-10-CM

## 2021-05-12 MED ORDER — SUTAB 1479-225-188 MG PO TABS: 1.0000 | ORAL_TABLET | ORAL | 0 refills | Status: DC

## 2021-05-12 NOTE — Telephone Encounter (Signed)
Inbound call from patient stating prep medication is over $150 but was informed by nurse should not be over $60.  Please advise.

## 2021-05-12 NOTE — Telephone Encounter (Signed)
Spoke with pt. She request Pills-sutab rx sent to pharmacy and new instructions sent to mychart-pt aware. Pt will get medicare coupon from [place] web site to take to pharmacy.

## 2021-05-16 ENCOUNTER — Ambulatory Visit (AMBULATORY_SURGERY_CENTER): Payer: Medicare Other | Admitting: Internal Medicine

## 2021-05-16 ENCOUNTER — Other Ambulatory Visit: Payer: Self-pay

## 2021-05-16 ENCOUNTER — Encounter: Payer: Self-pay | Admitting: Internal Medicine

## 2021-05-16 VITALS — BP 109/60 | HR 57 | Temp 97.1°F | Resp 13 | Ht 61.0 in | Wt 115.0 lb

## 2021-05-16 DIAGNOSIS — D128 Benign neoplasm of rectum: Secondary | ICD-10-CM | POA: Diagnosis not present

## 2021-05-16 DIAGNOSIS — Z86010 Personal history of colonic polyps: Secondary | ICD-10-CM

## 2021-05-16 DIAGNOSIS — K635 Polyp of colon: Secondary | ICD-10-CM | POA: Diagnosis not present

## 2021-05-16 DIAGNOSIS — D122 Benign neoplasm of ascending colon: Secondary | ICD-10-CM | POA: Diagnosis not present

## 2021-05-16 MED ORDER — SODIUM CHLORIDE 0.9 % IV SOLN: 500.0000 mL | Freq: Once | INTRAVENOUS | Status: DC

## 2021-05-16 NOTE — Progress Notes (Signed)
Pt's states no medical or surgical changes since previsit or office visit. 

## 2021-05-16 NOTE — Patient Instructions (Signed)
Handout given for polyps.  YOU HAD AN ENDOSCOPIC PROCEDURE TODAY AT THE [facility]:   Refer to the procedure report that was given to you for any specific questions about what was found during the examination.  If the procedure report does not answer your questions, please call your gastroenterologist to clarify.  If you requested that your care partner not be given the details of your procedure findings, then the procedure report has been included in a sealed envelope for you to review at your convenience later.  YOU SHOULD EXPECT: Some feelings of bloating in the abdomen. Passage of more gas than usual.  Walking can help get rid of the air that was put into your GI tract during the procedure and reduce the bloating. If you had a lower endoscopy (such as a colonoscopy or flexible sigmoidoscopy) you may notice spotting of blood in your stool or on the toilet paper. If you underwent a bowel prep for your procedure, you may not have a normal bowel movement for a few days.  Please Note:  You might notice some irritation and congestion in your nose or some drainage.  This is from the oxygen used during your procedure.  There is no need for concern and it should clear up in a day or so.  SYMPTOMS TO REPORT IMMEDIATELY:   Following lower endoscopy (colonoscopy or flexible sigmoidoscopy):  Excessive amounts of blood in the stool  Significant tenderness or worsening of abdominal pains  Swelling of the abdomen that is new, acute  Fever of 100F or higher  For urgent or emergent issues, a gastroenterologist can be reached at any hour by calling [phone]. Do not use MyChart messaging for urgent concerns.    DIET:  We do recommend a small meal at first, but then you may proceed to your regular diet.  Drink plenty of fluids but you should avoid alcoholic beverages for 24 hours.  ACTIVITY:  You should plan to take it easy for the rest of today and you should NOT DRIVE or use heavy  machinery until tomorrow (because of the sedation medicines used during the test).    FOLLOW UP: Our staff will call the number listed on your records 48-72 hours following your procedure to check on you and address any questions or concerns that you may have regarding the information given to you following your procedure. If we do not reach you, we will leave a message.  We will attempt to reach you two times.  During this call, we will ask if you have developed any symptoms of COVID 19. If you develop any symptoms (ie: fever, flu-like symptoms, shortness of breath, cough etc.) before then, please call [phone].  If you test positive for Covid 19 in the 2 weeks post procedure, please call and report this information to us.    If any biopsies were taken you will be contacted by phone or by letter within the next 1-3 weeks.  Please call us at [phone] if you have not heard about the biopsies in 3 weeks.    SIGNATURES/CONFIDENTIALITY: You and/or your care partner have signed paperwork which will be entered into your electronic medical record.  These signatures attest to the fact that that the information above on your After Visit Summary has been reviewed and is understood.  Full responsibility of the confidentiality of this discharge information lies with you and/or your care-partner. 

## 2021-05-16 NOTE — Progress Notes (Signed)
Called to room to assist during endoscopic procedure.  Patient ID and intended procedure confirmed with present staff. Received instructions for my participation in the procedure from the performing physician.  

## 2021-05-16 NOTE — Op Note (Signed)
[facility] ?Patient Name: Sheila Gray ?Procedure Date: 05/16/2021 1:49 PM ?MRN: [id number] ?Endoscopist: Docia Chuck. Henrene Pastor , MD ?Age: 71 ?Referring MD:  ?Date of Birth: 1950/11/13 ?Gender: Female ?Account #: [id number] ?Procedure:                Colonoscopy with cold snare polypectomy x 1; hot  ?                          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                          colon. Index exam 2011 (Dr. Deatra Ina, was normal);  ?                          August 2019 (Dr. Cristina Gong, traditional serrated  ?                          adenoma of the rectum). Now for surveillance ?Medicines:                Monitored Anesthesia Care ?Procedure:                Pre-Anesthesia Assessment: ?                          - Prior to the procedure, a History and Physical  ?                          was performed, and patient medications and  ?                          allergies were reviewed. The patient's tolerance of  ?                          previous anesthesia was also reviewed. The risks  ?                          and benefits of the procedure and the sedation  ?                          options and risks were discussed with the patient.  ?                          All questions were answered, and informed consent  ?                          was obtained. Prior Anticoagulants: The patient has  ?                          taken no previous anticoagulant or antiplatelet  ?  agents. ASA Grade Assessment: II - A patient with  ?                          mild systemic disease. After reviewing the risks  ?                          and benefits, the patient was deemed in  ?                          satisfactory condition to undergo the procedure. ?                          After obtaining informed consent, the colonoscope  ?                          was passed under direct vision.  Throughout the  ?                          procedure, the patient's blood pressure, pulse, and  ?                          oxygen saturations were monitored continuously. The  ?                          Olympus CF-HQ190L (#[id number]) Colonoscope was  ?                          introduced through the anus and advanced to the the  ?                          cecum, identified by appendiceal orifice and  ?                          ileocecal valve. The ileocecal valve, appendiceal  ?                          orifice, and rectum were photographed. The quality  ?                          of the bowel preparation was excellent. The  ?                          colonoscopy was performed without difficulty. The  ?                          patient tolerated the procedure well. The bowel  ?                          preparation used was SUPREP via split dose  ?                          instruction. ?Scope In: 2:06:21 PM ?Scope Out: 2:30:17 PM ?Scope Withdrawal Time: 0 hours 18 minutes 50 seconds  ?Total Procedure Duration: 0 hours 23 minutes  56 seconds  ?Findings:                 A 3 mm polyp was found in the ascending colon. The  ?                          polyp was sessile. The polyp was removed with a  ?                          cold snare. Resection and retrieval were complete. ?                          Two polyps were found in the distal rectum. The  ?                          polyps were 3 to 5 mm in size. These polyps were  ?                          removed with a hot snare. Resection and retrieval  ?                          were complete. ?                          Internal hemorrhoids were found during  ?                          retroflexion. The hemorrhoids were small. ?                          The exam was otherwise without abnormality on  ?                          direct and retroflexion views. ?Complications:            No immediate complications. Estimated blood loss:  ?                          None. ?Estimated  Blood Loss:     Estimated blood loss: none. ?Impression:               - One 3 mm polyp in the ascending colon, removed  ?                          with a cold snare. Resected and retrieved. ?                          - Two 3 to 5 mm polyps in the distal rectum,  ?                          removed with a hot snare. Resected and retrieved. ?                          - Internal hemorrhoids. ?                          -  The examination was otherwise normal on direct  ?                          and retroflexion views. ?Recommendation:           - Repeat colonoscopy in 5 years for surveillance. ?                          - Patient has a contact number available for  ?                          emergencies. The signs and symptoms of potential  ?                          delayed complications were discussed with the  ?                          patient. Return to normal activities tomorrow.  ?                          Written discharge instructions were provided to the  ?                          patient. ?                          - Resume previous diet. ?                          - Continue present medications. ?                          - Await pathology results. ?Docia Chuck. Henrene Pastor, MD ?05/16/2021 2:38:15 PM ?This report has been signed electronically. ?

## 2021-05-16 NOTE — Progress Notes (Signed)
Sedate, gd SR, tolerated procedure well, VSS, report to RN 

## 2021-05-16 NOTE — Progress Notes (Signed)
HISTORY OF PRESENT ILLNESS: ? ?Sheila Gray is a 71 y.o. female recently reestablished with this practice.  She presents today for surveillance colonoscopy.  Index colonoscopy with Dr. Deatra Ina 2011 was normal.  Colonoscopy with Dr. Cristina Gong August 2019 revealed traditional serrated adenoma.  Follow-up at this time recommended.  The patient has no active complaints. ? ? ?REVIEW OF SYSTEMS: ? ?All non-GI ROS negative.   ?Past Medical History:  ?Diagnosis Date  ? Allergy   ? GERD (gastroesophageal reflux disease)   ? Hyperlipidemia   ? Osteoporosis   ? Vitamin D deficiency   ? ? ?History reviewed. No pertinent surgical history. ? ?Social History ?Sheila Gray  reports that she has never smoked. She has never used smokeless tobacco. She reports current alcohol use of about 3.0 standard drinks per week. She reports that she does not use drugs. ? ?family history includes Heart disease in her father; Hypertension in her mother; Kidney disease in her father. ? ?Allergies  ?Allergen Reactions  ? Amoxicillin Rash  ? ? ?  ? ?PHYSICAL EXAMINATION: ? ?Vital signs: BP 130/76   Pulse 70   Temp (!) 97.1 ?F (36.2 ?C) (Temporal)   Ht 5\' 1"  (1.549 m)   Wt 115 lb (52.2 kg)   SpO2 98%   BMI 21.73 kg/m?  ?General: Well-developed, well-nourished, no acute distress ?HEENT: Sclerae are anicteric, conjunctiva pink. Oral mucosa intact ?Lungs: Clear ?Heart: Regular ?Abdomen: soft, nontender, nondistended, no obvious ascites, no peritoneal signs, normal bowel sounds. No organomegaly. ?Extremities: No edema ?Psychiatric: alert and oriented x3. Cooperative  ? ? ? ?ASSESSMENT: ? ?1.  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  She presents now for follow-up ? ? ?PLAN: ? ? ?1.  Surveillance colonoscopy ? ? ? ?  ?

## 2021-05-20 ENCOUNTER — Telehealth: Payer: Self-pay | Admitting: *Deleted

## 2021-05-20 ENCOUNTER — Telehealth: Payer: Self-pay

## 2021-05-20 NOTE — Telephone Encounter (Signed)
?  Follow up Call- ? ?Call back number 05/16/2021  ?Post procedure Call Back phone  # [phone]  ?Permission to leave phone message Yes  ?Some recent data might be hidden  ?  ? ?Patient questions: ? ?Do you have a fever, pain , or abdominal swelling? No. ?Pain Score  0 * ? ?Have you tolerated food without any problems? Yes.   ? ?Have you been able to return to your normal activities? Yes.   ? ?Do you have any questions about your discharge instructions: ?Diet   No. ?Medications  No. ?Follow up visit  No. ? ?Do you have questions or concerns about your Care? No. ? ?Actions: ?* If pain score is 4 or above: ?No action needed, pain <4. ? ? ?

## 2021-05-20 NOTE — Telephone Encounter (Signed)
Left message on f/u call 

## 2021-05-21 ENCOUNTER — Encounter: Payer: Self-pay | Admitting: Internal Medicine

## 2021-05-27 DIAGNOSIS — U071 COVID-19: Secondary | ICD-10-CM | POA: Diagnosis not present

## 2021-06-15 ENCOUNTER — Encounter: Payer: Self-pay | Admitting: Internal Medicine

## 2021-06-15 NOTE — Patient Instructions (Signed)

## 2021-06-15 NOTE — Progress Notes (Signed)
? ?Annual Screening/Preventative Visit ?& Comprehensive Evaluation &  Examination ? ?Future Appointments  ?Date Time Provider Department  ?06/16/2021 11:00 AM Unk Pinto, MD [facility]  ?01/29/2022                Wellness 11:00 AM Magda Bernheim, NP [facility]  ?06/23/2022 11:00 AM Unk Pinto, MD [facility]  ? ? ?    This very nice 71 y.o. MWF presents for a Screening /Preventative Visit & comprehensive evaluation and management of multiple medical co-morbidities.  Patient has been followed for labile HTN, HLD, Prediabetes  and Vitamin D Deficiency. Patient follows with Dr Wardell Heath for her Osteoporosis predating from the 1990's . Patient has hx/o GERD controlled by diet & OTC famotidine .  ? ? ?     Patient is followed expectantly for elevated BP and her BP has been controlled  and patient denies any cardiac symptoms as chest pain, palpitations, shortness of breath, dizziness or ankle swelling. Today's BP is at goal -  118/72.  ? ? ?    Patient's hyperlipidemia is controlled with diet and Ezetimibe.  Patient is statin intolerant. Patient denies myalgias or other medication SE's. Last lipids were at goal with a very high HDL of 99 and low LDL of 81 : ? ?Lab Results  ?Component Value Date  ? CHOL 205 (H) 01/29/2021  ? HDL 99 01/29/2021  ? [place] 81 01/29/2021  ? TRIG 153 (H) 01/29/2021  ? CHOLHDL 2.1 01/29/2021  ? ? ? ?    Patient is monitored for glucose intolerance  and patient denies reactive hypoglycemic symptoms, visual blurring, diabetic polys or paresthesias. Last A1c was normal & at goal : ? ?Lab Results  ?Component Value Date  ? HGBA1C 5.2 01/29/2021  ? ? ? ?    Finally, patient has history of Vitamin D Deficiency and last Vitamin D was  ? ?Lab Results  ?Component Value Date  ? VD25OH 89 09/12/2020  ? ? ? ?Current Outpatient Medications on File Prior to Visit  ?Medication Sig  ? acetaminophen  500 MG tablet Take 500 mg as needed by mouth.  ? aspirin EC 81 MG tablet Take daily.  ? CALCIUM CITRATE + D3   Take 4 tablets daily   ? VITAMIN D 5,000 Units  Take  daily.  ? ezetimibe 10 MG tablet TAKE 1 TABLET EVERY DAY  ? Famotidine 20 mg  Take 2 times daily.  ? fexofenadine 180 MG tablet Take 180 mg daily.  ? LUTEIN  Take 1 capsule  daily.  ? METROCREAM 0.75 %  APPLY TO FACE AT BEDTIME  ? Multiple Vitamin (MULTIVITAMIN) tablet Take 1 tablet daily.  ? Probiotic Product (PROBIOTIC PO) Take daily  ? Zinc 25 MG TABS Take daily.  ? ? ? ?Allergies  ?Allergen Reactions  ? Amoxicillin Rash  ? ? ? ?Past Medical History:  ?Diagnosis Date  ? Allergy   ? GERD (gastroesophageal reflux disease)   ? Hyperlipidemia   ? Osteoporosis   ? Vitamin D deficiency   ? ? ? ?Health Maintenance  ?Topic Date Due  ? COVID-19 Vaccine (6 - Booster) 03/01/2020  ? DEXA SCAN  06/14/2021  ? INFLUENZA VACCINE  10/14/2021  ? MAMMOGRAM  11/22/2021  ? TETANUS/TDAP  12/11/2025  ? Pneumonia Vaccine 32+ Years old  Completed  ? Hepatitis C Screening  Completed  ? Zoster Vaccines- Shingrix  Completed  ? HPV VACCINES  Aged Out  ? ? ? ?Immunization History  ?Administered Date(s) Administered  ?  Influenza, High Dose  12/20/2018, 12/25/2019  ? Influenza,inj,Quad  12/14/2016  ? Influenza 12/01/2017, 12/18/2020  ? [place] Sars-Covid-2 Vacc 04/14/2019, 05/15/2019, 01/05/2020  ? PPD Test 11/09/2013  ? Pneumococcal -13 03/29/2014  ? Pneumococcal -23 01/09/2016  ? Td 05/14/2005, 12/12/2015  ? Zoster Recombinat (Shingrix) 05/22/2020, 07/24/2020  ? Zoster, Live 11/28/2009  ? ? 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 ? ?Last MGM - 11/22/2020 ? ? ?Family History  ?Problem Relation Age of Onset  ? Hypertension Mother   ? Kidney disease Father   ? Heart disease Father   ? Colon cancer Neg Hx   ? Esophageal cancer Neg Hx   ? Rectal cancer Neg Hx   ? Stomach cancer Neg Hx   ? ? ? ?Social History  ? ?Tobacco Use  ? Smoking status: Never  ? Smokeless tobacco: Never  ?Substance Use  Topics  ? Alcohol use: Yes  ?  Alcohol/week: 3.0 standard drinks  ?  Types: 3 drink(s) per week  ? Drug use: No  ? ? ? ? ROS ?Constitutional: Denies fever, chills, weight loss/gain, headaches, insomnia,  night sweats, and change in appetite. Does c/o fatigue. ?Eyes: Denies redness, blurred vision, diplopia, discharge, itchy, watery eyes.  ?ENT: Denies discharge, congestion, post nasal drip, epistaxis, sore throat, earache, hearing loss, dental pain, Tinnitus, Vertigo, Sinus pain, snoring.  ?Cardio: Denies chest pain, palpitations, irregular heartbeat, syncope, dyspnea, diaphoresis, orthopnea, PND, claudication, edema ?Respiratory: denies cough, dyspnea, DOE, pleurisy, hoarseness, laryngitis, wheezing.  ?Gastrointestinal: Denies dysphagia, heartburn, reflux, water brash, pain, cramps, nausea, vomiting, bloating, diarrhea, constipation, hematemesis, melena, hematochezia, jaundice, hemorrhoids ?Genitourinary: Denies dysuria, frequency, urgency, nocturia, hesitancy, discharge, hematuria, flank pain ?Breast: Breast lumps, nipple discharge, bleeding.  ?Musculoskeletal: Denies arthralgia, myalgia, stiffness, Jt. Swelling, pain, limp, and strain/sprain. Denies falls. ?Skin: Denies puritis, rash, hives, warts, acne, eczema, changing in skin lesion ?Neuro: No weakness, tremor, incoordination, spasms, paresthesia, pain ?Psychiatric: Denies confusion, memory loss, sensory loss. Denies Depression. ?Endocrine: Denies change in weight, skin, hair change, nocturia, and paresthesia, diabetic polys, visual blurring, hyper / hypo glycemic episodes.  ?Heme/Lymph: No excessive bleeding, bruising, enlarged lymph nodes. ? ?Physical Exam ? ?BP 118/72   Pulse 69   Temp (!) 97.5 ?F (36.4 ?C)   Ht '5\' 1"'$  (1.549 m)   Wt 119 lb (54 kg)   SpO2 99%   BMI 22.48 kg/m?  ? ?General Appearance: Well nourished, well groomed and in no apparent distress. ? ?Eyes: PERRLA, EOMs, conjunctiva no swelling or erythema, normal fundi and vessels. ?Sinuses:  No frontal/maxillary tenderness ?ENT/Mouth: EACs patent / TMs  nl. Nares clear without erythema, swelling, mucoid exudates. Oral hygiene is good. No erythema, swelling, or exudate. Tongue normal, non-obstructing. Tonsils not swollen or erythematous. Hearing normal.  ?Neck: Supple, thyroid not palpable. No bruits, nodes or JVD. ?Respiratory: Respiratory effort normal.  BS equal and clear bilateral without rales, rhonci, wheezing or stridor. ?Cardio: Heart sounds are normal with regular rate and rhythm and no murmurs, rubs or gallops. Peripheral pulses are normal and equal bilaterally without edema. No aortic or femoral bruits. ?Chest: symmetric with normal excursions and percussion. ?Breasts: Symmetric, without lumps, nipple discharge, retractions, or fibrocystic changes.  ?Abdomen: Flat, soft with bowel sounds active. Nontender, no guarding, rebound, hernias, masses, or organomegaly.  ?Lymphatics: Non tender without lymphadenopathy.  ?Musculoskeletal: Full ROM all peripheral extremities, joint stability, 5/5 strength, and normal gait. ?Skin:  Warm and dry without rashes, lesions, cyanosis, clubbing or  ecchymosis.  ?Neuro: Cranial nerves intact, reflexes equal bilaterally. Normal muscle tone, no cerebellar symptoms. Sensation intact.  ?Pysch: Alert and oriented X 3, normal affect, Insight and Judgment appropriate.  ? ? ?Assessment and Plan ? ? ?1. Elevated BP without diagnosis of hypertension ? ?- EKG 12-Lead ?- Urinalysis, Routine w reflex microscopic ?- Microalbumin / creatinine urine ratio ?- CBC with Differential/Platelet ?- COMPLETE METABOLIC PANEL WITH GFR ?- Magnesium ?- TSH ? ?2. Hyperlipidemia, mixed ? ?- EKG 12-Lead ?- Lipid panel ?- TSH ? ?3. Abnormal glucose ? ?- EKG 12-Lead ?- Hemoglobin A1c ?- Insulin, random ? ?4. Vitamin D deficiency ? ?- VITAMIN D 25 Hydroxy  ? ?5. Age-related osteoporosis without current pathological fracture ? ?- COMPLETE METABOLIC PANEL WITH GFR ? ?6. Statin intolerance ? ? ?7.  Gastroesophageal reflux disease without esophagitis ? ?- CBC with Differential/Platelet ? ?8. Screening for ischemic heart disease ? ?- EKG 12-Lead ? ?9. FH: heart disease ? ?- EKG 12-Lead ? ?10. Medication man

## 2021-06-16 ENCOUNTER — Encounter: Payer: Self-pay | Admitting: Internal Medicine

## 2021-06-16 ENCOUNTER — Ambulatory Visit (INDEPENDENT_AMBULATORY_CARE_PROVIDER_SITE_OTHER): Payer: Medicare Other | Admitting: Internal Medicine

## 2021-06-16 VITALS — BP 118/72 | HR 69 | Temp 97.5°F | Ht 61.0 in | Wt 119.0 lb

## 2021-06-16 DIAGNOSIS — E559 Vitamin D deficiency, unspecified: Secondary | ICD-10-CM | POA: Diagnosis not present

## 2021-06-16 DIAGNOSIS — E782 Mixed hyperlipidemia: Secondary | ICD-10-CM

## 2021-06-16 DIAGNOSIS — Z136 Encounter for screening for cardiovascular disorders: Secondary | ICD-10-CM

## 2021-06-16 DIAGNOSIS — Z8249 Family history of ischemic heart disease and other diseases of the circulatory system: Secondary | ICD-10-CM

## 2021-06-16 DIAGNOSIS — R030 Elevated blood-pressure reading, without diagnosis of hypertension: Secondary | ICD-10-CM | POA: Diagnosis not present

## 2021-06-16 DIAGNOSIS — Z789 Other specified health status: Secondary | ICD-10-CM | POA: Diagnosis not present

## 2021-06-16 DIAGNOSIS — R7309 Other abnormal glucose: Secondary | ICD-10-CM | POA: Diagnosis not present

## 2021-06-16 DIAGNOSIS — K219 Gastro-esophageal reflux disease without esophagitis: Secondary | ICD-10-CM

## 2021-06-16 DIAGNOSIS — Z79899 Other long term (current) drug therapy: Secondary | ICD-10-CM | POA: Diagnosis not present

## 2021-06-16 DIAGNOSIS — M810 Age-related osteoporosis without current pathological fracture: Secondary | ICD-10-CM | POA: Diagnosis not present

## 2021-06-17 LAB — COMPLETE METABOLIC PANEL WITH GFR
AG Ratio: 1.8 (calc) (ref 1.0–2.5)
ALT: 25 U/L (ref 6–29)
AST: 21 U/L (ref 10–35)
Albumin: 4.6 g/dL (ref 3.6–5.1)
Alkaline phosphatase (APISO): 61 U/L (ref 37–153)
BUN: 21 mg/dL (ref 7–25)
CO2: 28 mmol/L (ref 20–32)
Calcium: 10.5 mg/dL — ABNORMAL HIGH (ref 8.6–10.4)
Chloride: 101 mmol/L (ref 98–110)
Creat: 0.92 mg/dL (ref 0.60–1.00)
Globulin: 2.6 g/dL (calc) (ref 1.9–3.7)
Glucose, Bld: 85 mg/dL (ref 65–99)
Potassium: 4.8 mmol/L (ref 3.5–5.3)
Sodium: 139 mmol/L (ref 135–146)
Total Bilirubin: 0.5 mg/dL (ref 0.2–1.2)
Total Protein: 7.2 g/dL (ref 6.1–8.1)
eGFR: 66 mL/min/{1.73_m2} (ref >=60)

## 2021-06-17 LAB — URINALYSIS, ROUTINE W REFLEX MICROSCOPIC
Bilirubin Urine: NEGATIVE
Glucose, UA: NEGATIVE
Hgb urine dipstick: NEGATIVE
Ketones, ur: NEGATIVE
Leukocytes,Ua: NEGATIVE
Nitrite: NEGATIVE
Protein, ur: NEGATIVE
Specific Gravity, Urine: 1.009 (ref 1.001–1.035)
pH: 6.5 (ref 5.0–8.0)

## 2021-06-17 LAB — LIPID PANEL
Cholesterol: 211 mg/dL — ABNORMAL HIGH (ref <200)
HDL: 99 mg/dL (ref >=50)
LDL Cholesterol (Calc): 91 mg/dL (calc)
Non-HDL Cholesterol (Calc): 112 mg/dL (calc) (ref <130)
Total CHOL/HDL Ratio: 2.1 (calc) (ref <5.0)
Triglycerides: 113 mg/dL (ref <150)

## 2021-06-17 LAB — CBC WITH DIFFERENTIAL/PLATELET
Absolute Monocytes: 563 cells/uL (ref 200–950)
Basophils Absolute: 40 cells/uL (ref 0–200)
Basophils Relative: 0.6 %
Eosinophils Absolute: 141 cells/uL (ref 15–500)
Eosinophils Relative: 2.1 %
HCT: 44.8 % (ref 35.0–45.0)
Hemoglobin: 14.7 g/dL (ref 11.7–15.5)
Lymphs Abs: 2459 cells/uL (ref 850–3900)
MCH: 29.8 pg (ref 27.0–33.0)
MCHC: 32.8 g/dL (ref 32.0–36.0)
MCV: 90.9 fL (ref 80.0–100.0)
MPV: 8.9 fL (ref 7.5–12.5)
Monocytes Relative: 8.4 %
Neutro Abs: 3497 cells/uL (ref 1500–7800)
Neutrophils Relative %: 52.2 %
Platelets: 242 10*3/uL (ref 140–400)
RBC: 4.93 10*6/uL (ref 3.80–5.10)
RDW: 12.8 % (ref 11.0–15.0)
Total Lymphocyte: 36.7 %
WBC: 6.7 10*3/uL (ref 3.8–10.8)

## 2021-06-17 LAB — INSULIN, RANDOM: Insulin: 8.4 u[IU]/mL

## 2021-06-17 LAB — HEMOGLOBIN A1C
Hgb A1c MFr Bld: 5.2 % of total Hgb (ref <5.7)
Mean Plasma Glucose: 103 mg/dL
eAG (mmol/L): 5.7 mmol/L

## 2021-06-17 LAB — MICROALBUMIN / CREATININE URINE RATIO
Creatinine, Urine: 32 mg/dL (ref 20–275)
Microalb, Ur: <0.2 mg/dL

## 2021-06-17 LAB — MAGNESIUM: Magnesium: 2.2 mg/dL (ref 1.5–2.5)

## 2021-06-17 LAB — VITAMIN D 25 HYDROXY (VIT D DEFICIENCY, FRACTURES): Vit D, 25-Hydroxy: 77 ng/mL (ref 30–100)

## 2021-06-17 LAB — TSH: TSH: 3.05 mIU/L (ref 0.40–4.50)

## 2021-06-17 NOTE — Progress Notes (Signed)
<><><><><><><><><><><><><><><><><><><><><><><><><><><><><><><><><> ?<><><><><><><><><><><><><><><><><><><><><><><><><><><><><><><><><> ?-   Test results slightly outside the reference range are not unusual. ?If there is anything important, I will review this with you,  ?otherwise it is considered normal test values.  ?If you have further questions,  ?please do not hesitate to contact me at the office or via My Chart.  ?<><><><><><><><><><><><><><><><><><><><><><><><><><><><><><><><><> ?<><><><><><><><><><><><><><><><><><><><><><><><><><><><><><><><><> ? ?-  Calcium is slightly elevated , So Recommend decrease your  ? ?Calcium tablets from 4 tablets down to 2 tablets  /day  ?<><><><><><><><><><><><><><><><><><><><><><><><><><><><><><><><><> ?<><><><><><><><><><><><><><><><><><><><><><><><><><><><><><><><><> ? ?-  Total Chol is slightly elevated , but OK since ? ?Have a very High Good HDL level of 99 -  Excellent  ? ?- Very low risk for Heart Attack  / Stroke ?============================================================ ?============================================================ ? ?-  A1c - Normal - No Diabetes   - Great  !  ?<><><><><><><><><><><><><><><><><><><><><><><><><><><><><><><><><> ?<><><><><><><><><><><><><><><><><><><><><><><><><><><><><><><><><> ? ?-  Vitamin D = 77 - Excellent - Please keep dose same  ?<><><><><><><><><><><><><><><><><><><><><><><><><><><><><><><><><> ?<><><><><><><><><><><><><><><><><><><><><><><><><><><><><><><><><> ? ?-  All Else - CBC - Kidneys -  U/A  - Electrolytes - Liver - Magnesium & Thyroid   ? ?- all  Normal / OK ?<><><><><><><><><><><><><><><><><><><><><><><><><><><><><><><><><> ?<><><><><><><><><><><><><><><><><><><><><><><><><><><><><><><><><> ? ?-  Keep up the  [place] Work  ! ?<><><><><><><><><><><><><><><><><><><><><><><><><><><><><><><><><> ?<><><><><><><><><><><><><><><><><><><><><><><><><><><><><><><><><> ? ? ? ? ? ? ? ? ? ? ? ? ? ? ?

## 2021-07-08 ENCOUNTER — Encounter: Payer: Self-pay | Admitting: Internal Medicine

## 2021-07-08 ENCOUNTER — Ambulatory Visit (INDEPENDENT_AMBULATORY_CARE_PROVIDER_SITE_OTHER): Payer: Medicare Other | Admitting: Internal Medicine

## 2021-07-08 VITALS — BP 118/72 | HR 78 | Temp 97.7°F | Resp 16 | Wt 120.8 lb

## 2021-07-08 DIAGNOSIS — J042 Acute laryngotracheitis: Secondary | ICD-10-CM | POA: Diagnosis not present

## 2021-07-08 MED ORDER — DEXAMETHASONE 4 MG PO TABS: ORAL_TABLET | ORAL | 0 refills | Status: DC

## 2021-07-08 MED ORDER — ACETAMINOPHEN-CODEINE #3 300-30 MG PO TABS: ORAL_TABLET | ORAL | 0 refills | Status: DC

## 2021-07-08 MED ORDER — BENZONATATE 200 MG PO CAPS: ORAL_CAPSULE | ORAL | 1 refills | Status: DC

## 2021-07-08 NOTE — Progress Notes (Signed)
? ? ?  Future Appointments  ?Date Time Provider Department  ?01/29/2022 11:00 AM Magda Bernheim, NP [facility]  ?06/23/2022 11:00 AM Unk Pinto, MD [facility]  ? ? ?History of Present Illness: ? ?Patient is a very nice 47 to MWF who relates being treated with a Zpak about 2 weeks ago for suspected bronchitis. Symptoms abated over last 24 hours developed paroxysms of spastic uncontrollable dry coughing w/o sputum pdn, fever , head /chest congestion. Denies sneezing, wheezing or exposure to irritant inhalents, dusts, fumes, etc.  ? ?Medications ? ?  ezetimibe 10 MG tablet, TAKE 1 TABLET EVERY DAY ?  fexofenadine  180 MG tablet, Take  daily. ?  aspirin EC 81 MG tablet, Take daily. ?  Calcium Citrate + D, Take 4 tablets daily  ?  VITAMIN D 5,000 Units Take daily. ?  Famotidine  20 mg, Take  2  times daily. ?  LUTEIN , Take 1 capsule daily. ?  METROCREAM 0.75 % cream, APPLY TO FACE AT BEDTIME ?  Multiple Vitamin , Take 1 tablet daily. ?  Probiotic Product , Take daily ?  Zinc 25 MG TABS, Take daily ? ?Problem list ?She has Allergic rhinitis; Osteoporosis; Vitamin D deficiency; Elevated BP without diagnosis of hypertension; Hyperlipidemia; Medication management; and Gastroesophageal reflux disease without esophagitis on their problem list. ?  ?Observations/Objective: ? ?BP 118/72   Pulse 78   Temp 97.7 ?F (36.5 ?C)   Resp 16   Wt 120 lb 12.8 oz (54.8 kg)   SpO2 97%   BMI 22.82 kg/m?  ? ?Dry coughing paroxysms. Voice very hoarse.  ? ?HEENT - WNL. ?Neck - supple.  ?Chest - Clear equal BS. No Rales, , rhonchi or wheezes.  ?Cor - Nl HS. RRR w/o sig MGR. PP 1(+). No edema. ?MS- FROM w/o deformities.  Gait Nl. ?Neuro -  Nl w/o focal abnormalities. ? ?Assessment and Plan: ? ?1. Laryngotracheitis  (Possibly viral vs allergic vs inhalent / irritant )  ? ?- dexamethasone  4 MG tablet;  ?Take 1 tab 3 x day for 5  days, then 2 x day for 5 days, then 1 tab daily   ?Dispense: 50 tablet ? ?- benzonatate (TESSALON) 200 MG;  ?Take   1 perle  3 x / day  to prevent cough   ?Dispense: 90 capsule; Refill: 1 ? ?- TYLENOL #3 300-30 MG ;  ?Take  1 tablet  every 4 hours  for Pain or Severe Cough  ? Dispense: 30 tablet ? ? ?Follow Up Instructions: ? ? ?    I discussed the assessment and treatment plan with the patient. The patient was provided an opportunity to ask questions and all were answered. The patient agreed with the plan and demonstrated an understanding of the instructions. ?  ?    The patient was advised to call back or seek an in-person evaluation if the symptoms worsen or if the condition fails to improve as anticipated. ? ? ?Kirtland Bouchard, MD ? ? ?

## 2021-07-11 ENCOUNTER — Other Ambulatory Visit: Payer: Self-pay | Admitting: Internal Medicine

## 2021-07-11 MED ORDER — METRONIDAZOLE 0.75 % EX CREA: TOPICAL_CREAM | CUTANEOUS | 10 refills | Status: DC

## 2021-07-16 ENCOUNTER — Other Ambulatory Visit: Payer: Self-pay | Admitting: Internal Medicine

## 2021-07-16 DIAGNOSIS — J042 Acute laryngotracheitis: Secondary | ICD-10-CM

## 2021-07-16 DIAGNOSIS — R053 Chronic cough: Secondary | ICD-10-CM

## 2021-07-17 ENCOUNTER — Ambulatory Visit
Admission: RE | Admit: 2021-07-17 | Discharge: 2021-07-17 | Disposition: A | Discharge status: Home / Self Care | Payer: Medicare Other | Source: Ambulatory Visit | Attending: Internal Medicine | Admitting: Internal Medicine

## 2021-07-17 DIAGNOSIS — J042 Acute laryngotracheitis: Secondary | ICD-10-CM

## 2021-07-17 DIAGNOSIS — R053 Chronic cough: Secondary | ICD-10-CM

## 2021-07-18 NOTE — Progress Notes (Signed)
<><><><><><><><><><><><> ?<><><><><><><><><><><><> ?<>                                          <> ?<>     CXR - Clear - Great  !    <> ? <>                                         <> ?<><><><><><><><><><><><> ?<><><><><><><><><><><><> ? ? ? ?

## 2021-07-22 DIAGNOSIS — U071 COVID-19: Secondary | ICD-10-CM | POA: Diagnosis not present

## 2021-07-24 ENCOUNTER — Other Ambulatory Visit: Payer: Self-pay | Admitting: Internal Medicine

## 2021-07-24 MED ORDER — DOXYCYCLINE HYCLATE 100 MG PO CAPS: ORAL_CAPSULE | ORAL | 0 refills | Status: DC

## 2021-09-05 ENCOUNTER — Other Ambulatory Visit: Payer: Self-pay | Admitting: Internal Medicine

## 2021-09-05 DIAGNOSIS — E782 Mixed hyperlipidemia: Secondary | ICD-10-CM

## 2021-09-05 MED ORDER — EZETIMIBE 10 MG PO TABS: ORAL_TABLET | ORAL | 3 refills | Status: DC

## 2021-09-25 DIAGNOSIS — Z01419 Encounter for gynecological examination (general) (routine) without abnormal findings: Secondary | ICD-10-CM | POA: Diagnosis not present

## 2021-11-24 DIAGNOSIS — M8588 Other specified disorders of bone density and structure, other site: Secondary | ICD-10-CM | POA: Diagnosis not present

## 2021-11-24 DIAGNOSIS — Z1231 Encounter for screening mammogram for malignant neoplasm of breast: Secondary | ICD-10-CM | POA: Diagnosis not present

## 2021-11-24 DIAGNOSIS — M810 Age-related osteoporosis without current pathological fracture: Secondary | ICD-10-CM | POA: Diagnosis not present

## 2021-11-24 LAB — HM MAMMOGRAPHY

## 2021-11-24 LAB — HM DEXA SCAN

## 2021-11-28 DIAGNOSIS — Z23 Encounter for immunization: Secondary | ICD-10-CM | POA: Diagnosis not present

## 2021-12-01 DIAGNOSIS — Z23 Encounter for immunization: Secondary | ICD-10-CM | POA: Diagnosis not present

## 2022-01-13 ENCOUNTER — Ambulatory Visit: Payer: Medicare Other | Admitting: Nurse Practitioner

## 2022-01-28 NOTE — Progress Notes (Unsigned)
MEDICARE ANNUAL WELLNESS VISIT AND CPE  Assessment:  Encounter for Medicare annual wellness exam  Due Annually  Health Maintenance Reviewed  Healthy lifestyle reviewed and goals set   Seasonal allergic rhinitis due to pollen Continue OTC allergy pills Get on nasal spray  Osteoporosis, unspecified osteoporosis type, unspecified pathological fracture presence -    Continue follow up, calcium supplementation and weight bearing exercises  Vitamin D deficiency Continue supplement  Elevated BP without diagnosis of hypertension - continue medications, DASH diet, exercise and monitor at home. Call if greater than 130/80.  -     CBC with Differential/Platelet -     CMP -     TSH  Hyperlipidemia, unspecified hyperlipidemia type -continue medications, check lipids, decrease fatty foods, increase activity.  -     Lipid panel  Medication management Continued  Abnormal Glucose A1c Continue diet and exercise    Over 30 minutes of exam, counseling, chart review and critical decision making was performed Future Appointments  Date Time Provider [place]  01/29/2022 11:00 AM Alycia Rossetti, NP [facility] None  06/23/2022 11:00 AM Unk Pinto, MD [facility] None     Plan:   During the course of the visit the patient was educated and counseled about appropriate screening and preventive services including:   Pneumococcal vaccine  Prevnar 13 Influenza vaccine Td vaccine Screening electrocardiogram Bone densitometry screening Colorectal cancer screening Diabetes screening Glaucoma screening Nutrition counseling  Advanced directives: requested   Subjective:  Sheila Gray is a 71 y.o. female who presents for Medicare Annual Wellness Visit and complete physical.    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   Her blood pressure has been controlled at home, today their BP is   BP Readings from Last 3 Encounters:  07/08/21  118/72  06/16/21 118/72  05/16/21 109/60    She does workout. She denies chest pain, shortness of breath, dizziness.   She is seeing Dr. Darnell Level for osteoporosis, doing osteostrong and exercising. Had DEXA 06/2019 and it was stable.   BMI is There is no height or weight on file to calculate BMI., she is working on diet and exercise. Has stopped making warm bread, continues to exercise.  Wt Readings from Last 3 Encounters:  07/08/21 120 lb 12.8 oz (54.8 kg)  06/16/21 119 lb (54 kg)  05/16/21 115 lb (52.2 kg)   She is on cholesterol medication , Zetia and denies myalgias. Her cholesterol is at goal. The cholesterol last visit was:   Lab Results  Component Value Date   CHOL 211 (H) 06/16/2021   HDL 99 06/16/2021   LDLCALC 91 06/16/2021   TRIG 113 06/16/2021   CHOLHDL 2.1 06/16/2021   Last A1C Lab Results  Component Value Date   HGBA1C 5.2 06/16/2021   Last GFR: Lab Results  Component Value Date   GFRNONAA 72 09/12/2020   Patient is on Vitamin D supplement.   Lab Results  Component Value Date   VD25OH 77 06/16/2021      Medication Review: Current Outpatient Medications on File Prior to Visit  Medication Sig Dispense Refill   acetaminophen-codeine (TYLENOL #3) 300-30 MG tablet Take  1 tablet  every 4 hours  for Pain or Severe Cough 30 tablet 0   aspirin EC 81 MG tablet Take 81 mg by mouth daily.     benzonatate (TESSALON) 200 MG capsule Take  1 perle  3 x / day  to prevent  cough 90 capsule 1   Calcium Citrate-Vitamin D (CALCIUM CITRATE + D3 PO) Take 4 tablets daily by mouth.      Cholecalciferol (VITAMIN D PO) Take 5,000 Units by mouth daily.     ezetimibe (ZETIA) 10 MG tablet Take  1 tablet  Daily for Cholesterol 90 tablet 3   Famotidine (PEPCID PO) Take 20 mg by mouth 2 (two) times daily.     fexofenadine (ALLEGRA) 180 MG tablet Take 180 mg by mouth daily.     LUTEIN PO Take 1 capsule by mouth daily.     metroNIDAZOLE (METROCREAM) 0.75 % cream APPLY TO FACE AT BEDTIME 45 g  10   Multiple Vitamin (MULTIVITAMIN) tablet Take 1 tablet by mouth daily.     Probiotic Product (PROBIOTIC PO) Take by mouth.     Zinc 25 MG TABS Take by mouth.     No current facility-administered medications on file prior to visit.    Allergies  Allergen Reactions   Amoxicillin Rash    Current Problems (verified) Patient Active Problem List   Diagnosis Date Noted   Gastroesophageal reflux disease without esophagitis 09/12/2020   Elevated BP without diagnosis of hypertension 06/19/2015   Hyperlipidemia 06/19/2015   Medication management 06/19/2015   Allergic rhinitis 04/10/2013   Osteoporosis 04/10/2013   Vitamin D deficiency 04/10/2013    Screening Tests Immunization History  Administered Date(s) Administered   Influenza, High Dose Seasonal PF 12/20/2018, 12/25/2019   Influenza,inj,Quad PF,6+ Mos 12/14/2016   Influenza-Unspecified 11/29/2013, 12/09/2015, 12/01/2017, 12/18/2020   [place] Sars-Covid-2 Vaccination 04/14/2019, 05/15/2019, 01/05/2020   PPD Test 11/09/2013   Pneumococcal Conjugate-13 03/29/2014   Pneumococcal Polysaccharide-23 01/09/2016   Td 05/14/2005, 12/12/2015   Zoster Recombinat (Shingrix) 05/22/2020, 07/24/2020   Zoster, Live 11/28/2009   Health Maintenance  Topic Date Due   Medicare Annual Wellness (AWV)  Never done   COVID-19 Vaccine (6 - Mixed Product risk series) 03/01/2020   DEXA SCAN  06/14/2021   INFLUENZA VACCINE  10/14/2021   MAMMOGRAM  11/22/2021   COLONOSCOPY (Pts 45-3yr Insurance coverage will need to be confirmed)  05/16/2024   TETANUS/TDAP  12/11/2025   Pneumonia Vaccine 72 Years old  Completed   Hepatitis C Screening  Completed   Zoster Vaccines- Shingrix  Completed   HPV VACCINES  Aged Out   Preventative care: Last colonoscopy: 10/2017- Dr. BCristina Gongdue in 2023  Flex Sig 06/26/2019 getting one next time Last mammogram: 11/22/20 benign repeat 1 year Last pap smear/pelvic exam: never abnormal pap 2017 DEXA: 06/2019 following  Dr. GDarnell Level-3.2 did not get worse.   Names of Other Physician/Practitioners you currently use: 1. [facility] here for primary care 2. [facility], eye doctor, last visit 2021 3. DShanon Brow dentist, last visit appt 2020 Patient Care Team: MUnk Pinto MD as PCP - General (Internal Medicine) KInda Castle MD (Inactive) as Consulting Physician (Gastroenterology) Love, JAlyson Locket MD as Consulting Physician (Neurology) LR[place] AlexandriaPAnder Slade MD (Rheumatology) TLavonna Monarch MD (Inactive) as Consulting Physician (Dermatology) WGarald Balding MD as Consulting Physician (Orthopedic Surgery) RGus Height MD (Inactive) as Consulting Physician (Obstetrics and Gynecology)  SURGICAL HISTORY She  has no past surgical history on file. FAMILY HISTORY Her family history includes Heart disease in her father; Hypertension in her mother; Kidney disease in her father. SOCIAL HISTORY She  reports that she has never smoked. She has never used smokeless tobacco. She reports current alcohol use of about 3.0 standard drinks of alcohol per week. She reports  that she does not use drugs.   MEDICARE WELLNESS OBJECTIVES: Physical activity:   Cardiac risk factors:   Depression/mood screen:      06/15/2021    9:28 PM  Depression screen PHQ 2/9  Decreased Interest 0  Down, Depressed, Hopeless 0  PHQ - 2 Score 0    ADLs:     06/15/2021    9:29 PM 01/29/2021   11:16 AM  In your present state of health, do you have any difficulty performing the following activities:  Hearing?  0  Vision? 0 0  Difficulty concentrating or making decisions? 0 0  Walking or climbing stairs? 0 0  Dressing or bathing? 0 0  Doing errands, shopping? 0 0     Cognitive Testing  Alert? Yes  Normal Appearance?Yes  Oriented to person? Yes  Place? Yes   Time? Yes  Recall of three objects?  Yes  Can perform simple calculations? Yes  Displays appropriate judgment?Yes  Can read the correct time from a  watch face?Yes  EOL planning:    Review of Systems  Constitutional: Negative.  Negative for chills, fever and weight loss.  HENT: Negative.  Negative for congestion and hearing loss.   Eyes: Negative.  Negative for blurred vision and double vision.  Respiratory:  Negative for cough, hemoptysis, sputum production, shortness of breath and wheezing.   Cardiovascular: Negative.  Negative for chest pain, palpitations, orthopnea and leg swelling.  Gastrointestinal: Negative.  Negative for abdominal pain, constipation, diarrhea, heartburn, nausea and vomiting.  Genitourinary: Negative.   Musculoskeletal: Negative.  Negative for falls, joint pain and myalgias.  Skin: Negative.  Negative for rash.  Neurological: Negative.  Negative for dizziness, tingling, tremors, loss of consciousness and headaches.  Endo/Heme/Allergies: Negative.   Psychiatric/Behavioral: Negative.  Negative for depression, memory loss and suicidal ideas.      Objective:     There were no vitals filed for this visit.  There is no height or weight on file to calculate BMI.  General appearance: alert, no distress, WD/WN, female HEENT: normocephalic, sclerae anicteric, TMs pearly, nares patent, no discharge or erythema, pharynx normal Oral cavity: MMM, no lesions Neck: supple, no lymphadenopathy, no thyromegaly, no masses Heart: RRR, normal S1, S2, no murmurs Lungs: CTA bilaterally, no wheezes, rhonchi, or rales Abdomen: +bs, soft, non tender, non distended, no masses, no hepatomegaly, no splenomegaly Musculoskeletal: nontender, no swelling, no obvious deformity Extremities: no edema, no cyanosis, no clubbing Pulses: 2+ symmetric, upper and lower extremities, normal cap refill Neurological: alert, oriented x 3, CN2-12 intact, strength normal upper extremities and lower extremities, sensation normal throughout, DTRs 2+ throughout, no cerebellar signs, gait normal Psychiatric: normal affect, behavior normal, pleasant     Medicare Attestation I have personally reviewed: The patient's medical and social history Their use of alcohol, tobacco or illicit drugs Their current medications and supplements The patient's functional ability including ADLs,fall risks, home safety risks, cognitive, and hearing and visual impairment Diet and physical activities Evidence for depression or mood disorders  The patient's weight, height, BMI, and visual acuity have been recorded in the chart.  I have made referrals, counseling, and provided education to the patient based on review of the above and I have provided the patient with a written personalized care plan for preventive services.     Alycia Rossetti, NP   01/28/2022

## 2022-01-29 ENCOUNTER — Encounter: Payer: Self-pay | Admitting: Nurse Practitioner

## 2022-01-29 ENCOUNTER — Ambulatory Visit (INDEPENDENT_AMBULATORY_CARE_PROVIDER_SITE_OTHER): Payer: Medicare Other | Admitting: Nurse Practitioner

## 2022-01-29 VITALS — BP 122/78 | HR 79 | Temp 97.5°F | Ht 61.0 in | Wt 123.0 lb

## 2022-01-29 DIAGNOSIS — E559 Vitamin D deficiency, unspecified: Secondary | ICD-10-CM

## 2022-01-29 DIAGNOSIS — Z0001 Encounter for general adult medical examination with abnormal findings: Secondary | ICD-10-CM

## 2022-01-29 DIAGNOSIS — R030 Elevated blood-pressure reading, without diagnosis of hypertension: Secondary | ICD-10-CM

## 2022-01-29 DIAGNOSIS — R7309 Other abnormal glucose: Secondary | ICD-10-CM | POA: Diagnosis not present

## 2022-01-29 DIAGNOSIS — M810 Age-related osteoporosis without current pathological fracture: Secondary | ICD-10-CM

## 2022-01-29 DIAGNOSIS — Z79899 Other long term (current) drug therapy: Secondary | ICD-10-CM | POA: Diagnosis not present

## 2022-01-29 DIAGNOSIS — R6889 Other general symptoms and signs: Secondary | ICD-10-CM

## 2022-01-29 DIAGNOSIS — J301 Allergic rhinitis due to pollen: Secondary | ICD-10-CM

## 2022-01-29 DIAGNOSIS — E782 Mixed hyperlipidemia: Secondary | ICD-10-CM | POA: Diagnosis not present

## 2022-01-29 DIAGNOSIS — Z0000 Encounter for general adult medical examination without abnormal findings: Secondary | ICD-10-CM

## 2022-01-29 LAB — CBC WITH DIFFERENTIAL/PLATELET
Absolute Monocytes: 717 cells/uL (ref 200–950)
Basophils Absolute: 51 cells/uL (ref 0–200)
Basophils Relative: 0.5 %
Eosinophils Absolute: 152 cells/uL (ref 15–500)
Eosinophils Relative: 1.5 %
HCT: 42.7 % (ref 35.0–45.0)
Hemoglobin: 14.4 g/dL (ref 11.7–15.5)
Lymphs Abs: 2990 cells/uL (ref 850–3900)
MCH: 30.4 pg (ref 27.0–33.0)
MCHC: 33.7 g/dL (ref 32.0–36.0)
MCV: 90.3 fL (ref 80.0–100.0)
MPV: 8.7 fL (ref 7.5–12.5)
Monocytes Relative: 7.1 %
Neutro Abs: 6191 cells/uL (ref 1500–7800)
Neutrophils Relative %: 61.3 %
Platelets: 295 10*3/uL (ref 140–400)
RBC: 4.73 10*6/uL (ref 3.80–5.10)
RDW: 12.2 % (ref 11.0–15.0)
Total Lymphocyte: 29.6 %
WBC: 10.1 10*3/uL (ref 3.8–10.8)

## 2022-01-29 LAB — LIPID PANEL
Cholesterol: 200 mg/dL — ABNORMAL HIGH (ref <200)
HDL: 100 mg/dL (ref >=50)
LDL Cholesterol (Calc): 75 mg/dL (calc)
Non-HDL Cholesterol (Calc): 100 mg/dL (calc) (ref <130)
Total CHOL/HDL Ratio: 2 (calc) (ref <5.0)
Triglycerides: 151 mg/dL — ABNORMAL HIGH (ref <150)

## 2022-01-29 LAB — COMPLETE METABOLIC PANEL WITH GFR
AG Ratio: 1.4 (calc) (ref 1.0–2.5)
ALT: 10 U/L (ref 6–29)
AST: 19 U/L (ref 10–35)
Albumin: 4.4 g/dL (ref 3.6–5.1)
Alkaline phosphatase (APISO): 59 U/L (ref 37–153)
BUN: 18 mg/dL (ref 7–25)
CO2: 31 mmol/L (ref 20–32)
Calcium: 10.4 mg/dL (ref 8.6–10.4)
Chloride: 99 mmol/L (ref 98–110)
Creat: 0.85 mg/dL (ref 0.60–1.00)
Globulin: 3.1 g/dL (calc) (ref 1.9–3.7)
Glucose, Bld: 84 mg/dL (ref 65–99)
Potassium: 5.1 mmol/L (ref 3.5–5.3)
Sodium: 137 mmol/L (ref 135–146)
Total Bilirubin: 0.5 mg/dL (ref 0.2–1.2)
Total Protein: 7.5 g/dL (ref 6.1–8.1)
eGFR: 73 mL/min/{1.73_m2} (ref >=60)

## 2022-01-29 LAB — MAGNESIUM: Magnesium: 2.2 mg/dL (ref 1.5–2.5)

## 2022-01-29 NOTE — Patient Instructions (Signed)

## 2022-05-18 ENCOUNTER — Encounter: Payer: Self-pay | Admitting: Internal Medicine

## 2022-06-19 DIAGNOSIS — Z23 Encounter for immunization: Secondary | ICD-10-CM | POA: Diagnosis not present

## 2022-06-22 ENCOUNTER — Encounter: Payer: Self-pay | Admitting: Internal Medicine

## 2022-06-22 NOTE — Progress Notes (Signed)
Annual Screening/Preventative Visit & Comprehensive Evaluation &  Examination  Future Appointments  Date Time Provider Department  06/23/2022 11:00 AM [name], MD [facility]  06/23/2023  2:00 PM [name], MD [facility]        This very nice 72 y.o. MWF  with  labile HTN, HLD, Prediabetes  and Vitamin D Deficiency  presents for a Screening /Preventative Visit & comprehensive evaluation and management of multiple medical co-morbidities.  Patient follows with Dr Wendy Poet for her Osteoporosis predating from the 1990's . Patient has hx/o GERD controlled by diet & OTC famotidine .          Patient is followed expectantly for elevated BP and her BP has been controlled  and patient denies any cardiac symptoms as chest pain, palpitations, shortness of breath, dizziness or ankle swelling. Today's BP is at goal - 118/78 .         Patient's hyperlipidemia is controlled with diet and Ezetimibe.  Patient is statin intolerant. Patient denies myalgias or other medication SE's. Last lipids were at goal with a very high HDL of 100 and low LDL of 75 :  Lab Results  Component Value Date   CHOL 200 (H) 01/29/2022   HDL 100 01/29/2022   LDLCALC 75 01/29/2022   TRIG 151 (H) 01/29/2022   CHOLHDL 2.0 01/29/2022         Patient is monitored for glucose intolerance  and patient denies reactive hypoglycemic symptoms, visual blurring, diabetic polys or paresthesias. Last A1c was normal & at goal :  Lab Results  Component Value Date   HGBA1C 5.2 06/16/2021         Finally, patient has history of Vitamin D Deficiency and last Vitamin D was at goal :  Lab Results  Component Value Date   VD25OH 77 06/16/2021       Current Outpatient Medications:    aspirin EC 81 MG tablet, Take  daily   Calcium Citrate-Vitamin D , Take 4 tablets daily  VITAMIN D  5,000 Units, Take daily   ezetimibe  10 MG tablet, Take  1 tablet  Daily   Famotidine 20 mg , Take 2 times daily   fexofenadine 180  MG tablet, Take daily   LUTEIN , Take 1 capsule  daily   metroNIDAZOLE (METROCREAM) 0.75 % cream, APPLY  AT BEDTIME   Multiple Vitamin, Take daily   Probiotic daily   Zinc 25 MG TABS, Take daily    Allergies  Allergen Reactions   Amoxicillin Rash     Past Medical History:  Diagnosis Date   Allergy    GERD (gastroesophageal reflux disease)    Hyperlipidemia    Osteoporosis    Vitamin D deficiency      Health Maintenance  Topic Date Due   COVID-19 Vaccine (6 - Booster) 03/01/2020   DEXA SCAN  06/14/2021   INFLUENZA VACCINE  10/14/2021   MAMMOGRAM  11/22/2021   TETANUS/TDAP  12/11/2025   Pneumonia Vaccine 8+ Years old  Completed   Hepatitis C Screening  Completed   Zoster Vaccines- Shingrix  Completed   HPV VACCINES  Aged Out     Immunization History  Administered Date(s) Administered   Influenza, High Dose  12/20/2018, 12/25/2019   Influenza,inj,Quad  12/14/2016   Influenza 12/01/2017, 12/18/2020   Moderna Sars-Covid-2 Vacc 04/14/2019, 05/15/2019, 01/05/2020   PPD Test 11/09/2013   Pneumococcal -13 03/29/2014   Pneumococcal -23 01/09/2016   Td 05/14/2005, 12/12/2015   Zoster Recombinat (Shingrix)  05/22/2020, 07/24/2020   Zoster, Live 11/28/2009    Last Colon - 05/16/2021 - Dr Marina Goodell - Recc f/u Colon in 3 years for                                   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 - due Mar 2026   Last MGM - 11/22/2020  Last dexaBMD - 11/24/2021 - Recc 2 year f/u - due Sept 2025  Family History  Problem Relation Age of Onset   Hypertension Mother    Kidney disease Father    Heart disease Father    Colon cancer Neg Hx    Esophageal cancer Neg Hx    Rectal cancer Neg Hx    Stomach cancer Neg Hx      Social History   Tobacco Use   Smoking status: Never   Smokeless tobacco: Never  Substance Use Topics   Alcohol use: Yes    Alcohol/week: 3.0 standard drinks    Types: 3 drink(s) per week   Drug use: No       ROS Constitutional: Denies fever, chills, weight loss/gain, headaches, insomnia,  night sweats, and change in appetite. Does c/o fatigue. Eyes: Denies redness, blurred vision, diplopia, discharge, itchy, watery eyes.  ENT: Denies discharge, congestion, post nasal drip, epistaxis, sore throat, earache, hearing loss, dental pain, Tinnitus, Vertigo, Sinus pain, snoring.  Cardio: Denies chest pain, palpitations, irregular heartbeat, syncope, dyspnea, diaphoresis, orthopnea, PND, claudication, edema Respiratory: denies cough, dyspnea, DOE, pleurisy, hoarseness, laryngitis, wheezing.  Gastrointestinal: Denies dysphagia, heartburn, reflux, water brash, pain, cramps, nausea, vomiting, bloating, diarrhea, constipation, hematemesis, melena, hematochezia, jaundice, hemorrhoids Genitourinary: Denies dysuria, frequency, urgency, nocturia, hesitancy, discharge, hematuria, flank pain Breast: Breast lumps, nipple discharge, bleeding.  Musculoskeletal: Denies arthralgia, myalgia, stiffness, Jt. Swelling, pain, limp, and strain/sprain. Denies falls. Skin: Denies puritis, rash, hives, warts, acne, eczema, changing in skin lesion Neuro: No weakness, tremor, incoordination, spasms, paresthesia, pain Psychiatric: Denies confusion, memory loss, sensory loss. Denies Depression. Endocrine: Denies change in weight, skin, hair change, nocturia, and paresthesia, diabetic polys, visual blurring, hyper / hypo glycemic episodes.  Heme/Lymph: No excessive bleeding, bruising, enlarged lymph nodes.  Physical Exam  BP 118/78   Pulse 72   Temp 97.9 F (36.6 C)   Resp 16   Ht 5\' 1"  (1.549 m)   Wt 124 lb 9.6 oz (56.5 kg)   SpO2 99%   BMI 23.54 kg/m   General Appearance: Well nourished, well groomed and in no apparent distress.  Eyes: PERRLA, EOMs, conjunctiva no swelling or erythema, normal fundi and vessels. Sinuses: No frontal/maxillary tenderness ENT/Mouth: EACs patent / TMs  nl. Nares clear without erythema,  swelling, mucoid exudates. Oral hygiene is good. No erythema, swelling, or exudate. Tongue normal, non-obstructing. Tonsils not swollen or erythematous. Hearing normal.  Neck: Supple, thyroid not palpable. No bruits, nodes or JVD. Respiratory: Respiratory effort normal.  BS equal and clear bilateral without rales, rhonci, wheezing or stridor. Cardio: Heart sounds are normal with regular rate and rhythm and no murmurs, rubs or gallops. Peripheral pulses are normal and equal bilaterally without edema. No aortic or femoral bruits. Chest: symmetric with normal excursions and percussion. Breasts: Deferred to MGM Abdomen: Flat, soft with bowel sounds active. Nontender, no guarding, rebound, hernias, masses, or organomegaly.  Lymphatics: Non tender without lymphadenopathy.  Musculoskeletal: Full ROM all peripheral extremities, joint stability, 5/5 strength, and normal gait. Skin: Warm and dry without  rashes, lesions, cyanosis, clubbing or  ecchymosis.  Neuro: Cranial nerves intact, reflexes equal bilaterally. Normal muscle tone, no cerebellar symptoms. Sensation intact.  Pysch: Alert and oriented X 3, normal affect, Insight and Judgment appropriate.    Assessment and Plan   1. Elevated BP without diagnosis of hypertension  - EKG 12-Lead - Urinalysis, Routine w reflex microscopic - Microalbumin / creatinine urine ratio - CBC with Differential/Platelet - COMPLETE METABOLIC PANEL WITH GFR - Magnesium - TSH  2. Hyperlipidemia, mixed  - EKG 12-Lead - Lipid panel - TSH  3. Abnormal glucose  - EKG 12-Lead - Hemoglobin A1c - Insulin, random  4. Vitamin D deficiency  - VITAMIN D 25 Hydroxy   5. Age-related osteoporosis without current pathological fracture  - COMPLETE METABOLIC PANEL WITH GFR  - Advised start Vitamin K2 /MK-7.  6. Statin intolerance   7. Gastroesophageal reflux disease without esophagitis  - CBC with Differential/Platelet  8. Screening for ischemic heart  disease  - EKG 12-Lead  9. FH: heart disease  - EKG 12-Lead  10. Medication management  - Urinalysis, Routine w reflex microscopic - Microalbumin / creatinine urine ratio - CBC with Differential/Platelet - COMPLETE METABOLIC PANEL WITH GFR - Magnesium - Lipid panel - TSH - Hemoglobin A1c - Insulin, random - VITAMIN D 25 Hydroxy          Patient was counseled in prudent diet to achieve/maintain BMI less than 25 for weight control, BP monitoring, regular exercise and medications. Discussed med's effects and SE's. Screening labs and tests as requested with regular follow-up as recommended. Over 40 minutes of exam, counseling, chart review and high complex critical decision making was performed.   Marinus MawWilliam D Jenaro Souder, MD

## 2022-06-22 NOTE — Progress Notes (Incomplete)
Annual Screening/Preventative Visit & Comprehensive Evaluation &  Examination  Future Appointments  Date Time Provider Department  06/23/2022 11:00 AM Sheila CowboyMcKeown, Dorann Davidson, MD [facility]  06/23/2023  2:00 PM Sheila CowboyMcKeown, Rhiannon Sassaman, MD [facility]        This very nice 72 y.o. MWF  with  labile HTN, HLD, Prediabetes  and Vitamin D Deficiency  presents for a Screening /Preventative Visit & comprehensive evaluation and management of multiple medical co-morbidities.  Patient follows with Dr Wendy Poet [place] for her Osteoporosis predating from the 1990's . Patient has hx/o GERD controlled by diet & OTC famotidine .          Patient is followed expectantly for elevated BP and her BP has been controlled  and patient denies any cardiac symptoms as chest pain, palpitations, shortness of breath, dizziness or ankle swelling. Today's BP is at goal -                        .         Patient's hyperlipidemia is controlled with diet and Ezetimibe.  Patient is statin intolerant. Patient denies myalgias or other medication SE's. Last lipids were at goal with a very high HDL of 100 and low LDL of 75 : Lab Results  Component Value Date   CHOL 200 (H) 01/29/2022   HDL 100 01/29/2022   LDLCALC 75 01/29/2022   TRIG 151 (H) 01/29/2022   CHOLHDL 2.0 01/29/2022         Patient is monitored for glucose intolerance  and patient denies reactive hypoglycemic symptoms, visual blurring, diabetic polys or paresthesias. Last A1c was normal & at goal :  Lab Results  Component Value Date   HGBA1C 5.2 06/16/2021         Finally, patient has history of Vitamin D Deficiency and last Vitamin D was   Lab Results  Component Value Date   VD25OH 77 06/16/2021       Current Outpatient Medications:  .  aspirin EC 81 MG tablet, Take  daily .  Calcium Citrate-Vitamin D , Take 4 tablets daily . VITAMIN D  5,000 Units, Take daily .  ezetimibe  10 MG tablet, Take  1 tablet  Daily .  Famotidine 20 mg , Take 2 times daily .   fexofenadine 180 MG tablet, Take daily .  LUTEIN , Take 1 capsule  daily .  metroNIDAZOLE (METROCREAM) 0.75 % cream, APPLY  AT BEDTIME .  Multiple Vitamin, Take daily .  Probiotic daily .  Zinc 25 MG TABS, Take daily    Allergies  Allergen Reactions  . Amoxicillin Rash     Past Medical History:  Diagnosis Date  . Allergy   . GERD (gastroesophageal reflux disease)   . Hyperlipidemia   . Osteoporosis   . Vitamin D deficiency      Health Maintenance  Topic Date Due  . COVID-19 Vaccine (6 - Booster) 03/01/2020  . DEXA SCAN  06/14/2021  . INFLUENZA VACCINE  10/14/2021  . MAMMOGRAM  11/22/2021  . TETANUS/TDAP  12/11/2025  . Pneumonia Vaccine 3365+ Years old  Completed  . Hepatitis C Screening  Completed  . Zoster Vaccines- Shingrix  Completed  . HPV VACCINES  Aged Out     Immunization History  Administered Date(s) Administered  . Influenza, High Dose  12/20/2018, 12/25/2019  . Influenza,inj,Quad  12/14/2016  . Influenza 12/01/2017, 12/18/2020  . Moderna Sars-Covid-2 Vacc 04/14/2019, 05/15/2019, 01/05/2020  . PPD Test 11/09/2013  .  Pneumococcal -13 03/29/2014  . Pneumococcal -23 01/09/2016  . Td 05/14/2005, 12/12/2015  . Zoster Recombinat (Shingrix) 05/22/2020, 07/24/2020  . 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 Hypertension Mother   . Kidney disease Father   . Heart disease Father   . Colon cancer Neg Hx   . Esophageal cancer Neg Hx   . Rectal cancer Neg Hx   . Stomach cancer Neg Hx      Social History   Tobacco Use  . Smoking status: Never  . Smokeless tobacco: Never  Substance Use Topics  . Alcohol use: Yes    Alcohol/week: 3.0 standard drinks    Types: 3 drink(s) per week  . Drug use: No       ROS Constitutional: Denies fever, chills, weight loss/gain, headaches, insomnia,  night sweats, and change in appetite. Does c/o fatigue. Eyes: Denies redness, blurred vision, diplopia, discharge, itchy, watery eyes.  ENT: Denies discharge, congestion, post nasal drip, epistaxis, sore throat, earache, hearing loss, dental pain, Tinnitus, Vertigo, Sinus pain, snoring.  Cardio: Denies chest pain, palpitations, irregular heartbeat, syncope, dyspnea, diaphoresis, orthopnea, PND, claudication, edema Respiratory: denies cough, dyspnea, DOE, pleurisy, hoarseness, laryngitis, wheezing.  Gastrointestinal: Denies dysphagia, heartburn, reflux, water brash, pain, cramps, nausea, vomiting, bloating, diarrhea, constipation, hematemesis, melena, hematochezia, jaundice, hemorrhoids Genitourinary: Denies dysuria, frequency, urgency, nocturia, hesitancy, discharge, hematuria, flank pain Breast: Breast lumps, nipple discharge, bleeding.  Musculoskeletal: Denies arthralgia, myalgia, stiffness, Jt. Swelling, pain, limp, and strain/sprain. Denies falls. Skin: Denies puritis, rash, hives, warts, acne, eczema, changing in skin lesion Neuro: No weakness, tremor, incoordination, spasms, paresthesia, pain Psychiatric: Denies confusion, memory loss, sensory loss. Denies Depression. Endocrine: Denies change in weight, skin, hair change, nocturia, and paresthesia, diabetic polys, visual blurring, hyper / hypo glycemic episodes.  Heme/Lymph: No excessive bleeding, bruising, enlarged lymph nodes.  Physical Exam  There were no vitals taken for this visit.  General Appearance: Well nourished, well groomed and in no apparent distress.  Eyes: PERRLA, EOMs, conjunctiva no swelling or erythema, normal fundi and vessels. Sinuses: No frontal/maxillary tenderness ENT/Mouth: EACs patent / TMs  nl. Nares clear without erythema, swelling, mucoid exudates. Oral hygiene is good. No erythema, swelling, or exudate. Tongue normal,  non-obstructing. Tonsils not swollen or erythematous. Hearing normal.  Neck: Supple, thyroid not palpable. No bruits, nodes or JVD. Respiratory: Respiratory effort normal.  BS equal and clear bilateral without rales, rhonci, wheezing or stridor. Cardio: Heart sounds are normal with regular rate and rhythm and no murmurs, rubs or gallops. Peripheral pulses are normal and equal bilaterally without edema. No aortic or femoral bruits. Chest: symmetric with normal excursions and percussion. Breasts: Symmetric, without lumps, nipple discharge, retractions, or fibrocystic changes.  Abdomen: Flat, soft with bowel sounds active. Nontender, no guarding, rebound, hernias, masses, or organomegaly.  Lymphatics: Non tender without lymphadenopathy.  Musculoskeletal: Full ROM all peripheral extremities, joint stability, 5/5 strength, and normal gait. Skin: Warm and dry without rashes, lesions, cyanosis, clubbing or  ecchymosis.  Neuro: Cranial nerves intact, reflexes equal bilaterally. Normal muscle tone, no cerebellar symptoms. Sensation intact.  Pysch: Alert and oriented X 3, normal affect, Insight and Judgment appropriate.    Assessment and Plan   1. Elevated BP without diagnosis of hypertension  - EKG 12-Lead - Urinalysis, Routine w reflex microscopic - Microalbumin / creatinine urine ratio - CBC with Differential/Platelet - COMPLETE METABOLIC PANEL WITH GFR - Magnesium - TSH  2. Hyperlipidemia, mixed  - EKG 12-Lead - Lipid panel - TSH  3. Abnormal glucose  - EKG 12-Lead - Hemoglobin A1c - Insulin, random  4. Vitamin D deficiency  - VITAMIN D 25 Hydroxy   5. Age-related osteoporosis without current pathological fracture  - COMPLETE METABOLIC PANEL WITH GFR  6. Statin intolerance   7. Gastroesophageal reflux disease without esophagitis  - CBC with Differential/Platelet  8. Screening for ischemic heart disease  - EKG 12-Lead  9. FH: heart disease  - EKG 12-Lead  10.  Medication management  - Urinalysis, Routine w reflex microscopic - Microalbumin / creatinine urine ratio - CBC with Differential/Platelet - COMPLETE METABOLIC PANEL WITH GFR - Magnesium - Lipid panel - TSH - Hemoglobin A1c - Insulin, random - VITAMIN D 25 Hydroxy          Patient was counseled in prudent diet to achieve/maintain BMI less than 25 for weight control, BP monitoring, regular exercise and medications. Discussed med's effects and SE's. Screening labs and tests as requested with regular follow-up as recommended. Over 40 minutes of exam, counseling, chart review and high complex critical decision making was performed.   Marinus Maw, MD

## 2022-06-22 NOTE — Patient Instructions (Signed)

## 2022-06-23 ENCOUNTER — Encounter: Payer: Self-pay | Admitting: Internal Medicine

## 2022-06-23 ENCOUNTER — Ambulatory Visit (INDEPENDENT_AMBULATORY_CARE_PROVIDER_SITE_OTHER): Payer: Medicare Other | Admitting: Internal Medicine

## 2022-06-23 VITALS — BP 118/78 | HR 72 | Temp 97.9°F | Resp 16 | Ht 61.0 in | Wt 124.6 lb

## 2022-06-23 DIAGNOSIS — E559 Vitamin D deficiency, unspecified: Secondary | ICD-10-CM

## 2022-06-23 DIAGNOSIS — K219 Gastro-esophageal reflux disease without esophagitis: Secondary | ICD-10-CM | POA: Diagnosis not present

## 2022-06-23 DIAGNOSIS — R030 Elevated blood-pressure reading, without diagnosis of hypertension: Secondary | ICD-10-CM | POA: Diagnosis not present

## 2022-06-23 DIAGNOSIS — E782 Mixed hyperlipidemia: Secondary | ICD-10-CM

## 2022-06-23 DIAGNOSIS — Z136 Encounter for screening for cardiovascular disorders: Secondary | ICD-10-CM

## 2022-06-23 DIAGNOSIS — Z8249 Family history of ischemic heart disease and other diseases of the circulatory system: Secondary | ICD-10-CM

## 2022-06-23 DIAGNOSIS — Z79899 Other long term (current) drug therapy: Secondary | ICD-10-CM | POA: Diagnosis not present

## 2022-06-23 DIAGNOSIS — Z1211 Encounter for screening for malignant neoplasm of colon: Secondary | ICD-10-CM

## 2022-06-23 DIAGNOSIS — R7309 Other abnormal glucose: Secondary | ICD-10-CM

## 2022-06-23 DIAGNOSIS — M810 Age-related osteoporosis without current pathological fracture: Secondary | ICD-10-CM

## 2022-06-23 DIAGNOSIS — Z789 Other specified health status: Secondary | ICD-10-CM

## 2022-06-24 LAB — LIPID PANEL
Cholesterol: 216 mg/dL — ABNORMAL HIGH (ref <200)
HDL: 99 mg/dL (ref >=50)
LDL Cholesterol (Calc): 91 mg/dL (calc)
Non-HDL Cholesterol (Calc): 117 mg/dL (calc) (ref <130)
Total CHOL/HDL Ratio: 2.2 (calc) (ref <5.0)
Triglycerides: 161 mg/dL — ABNORMAL HIGH (ref <150)

## 2022-06-24 LAB — CBC WITH DIFFERENTIAL/PLATELET
Absolute Monocytes: 447 cells/uL (ref 200–950)
Basophils Absolute: 19 cells/uL (ref 0–200)
Basophils Relative: 0.3 %
Eosinophils Absolute: 107 cells/uL (ref 15–500)
Eosinophils Relative: 1.7 %
HCT: 43.1 % (ref 35.0–45.0)
Hemoglobin: 14.5 g/dL (ref 11.7–15.5)
Lymphs Abs: 2363 cells/uL (ref 850–3900)
MCH: 30.3 pg (ref 27.0–33.0)
MCHC: 33.6 g/dL (ref 32.0–36.0)
MCV: 90.2 fL (ref 80.0–100.0)
MPV: 8.9 fL (ref 7.5–12.5)
Monocytes Relative: 7.1 %
Neutro Abs: 3364 cells/uL (ref 1500–7800)
Neutrophils Relative %: 53.4 %
Platelets: 250 10*3/uL (ref 140–400)
RBC: 4.78 10*6/uL (ref 3.80–5.10)
RDW: 13.1 % (ref 11.0–15.0)
Total Lymphocyte: 37.5 %
WBC: 6.3 10*3/uL (ref 3.8–10.8)

## 2022-06-24 LAB — COMPLETE METABOLIC PANEL WITH GFR
AG Ratio: 1.8 (calc) (ref 1.0–2.5)
ALT: 19 U/L (ref 6–29)
AST: 18 U/L (ref 10–35)
Albumin: 4.6 g/dL (ref 3.6–5.1)
Alkaline phosphatase (APISO): 51 U/L (ref 37–153)
BUN: 21 mg/dL (ref 7–25)
CO2: 29 mmol/L (ref 20–32)
Calcium: 10 mg/dL (ref 8.6–10.4)
Chloride: 101 mmol/L (ref 98–110)
Creat: 0.96 mg/dL (ref 0.60–1.00)
Globulin: 2.6 g/dL (calc) (ref 1.9–3.7)
Glucose, Bld: 86 mg/dL (ref 65–99)
Potassium: 4.5 mmol/L (ref 3.5–5.3)
Sodium: 140 mmol/L (ref 135–146)
Total Bilirubin: 0.4 mg/dL (ref 0.2–1.2)
Total Protein: 7.2 g/dL (ref 6.1–8.1)
eGFR: 62 mL/min/{1.73_m2} (ref >=60)

## 2022-06-24 LAB — HEMOGLOBIN A1C
Hgb A1c MFr Bld: 5.5 % of total Hgb (ref <5.7)
Mean Plasma Glucose: 111 mg/dL
eAG (mmol/L): 6.2 mmol/L

## 2022-06-24 LAB — URINALYSIS, ROUTINE W REFLEX MICROSCOPIC
Bilirubin Urine: NEGATIVE
Glucose, UA: NEGATIVE
Hgb urine dipstick: NEGATIVE
Ketones, ur: NEGATIVE
Leukocytes,Ua: NEGATIVE
Nitrite: NEGATIVE
Protein, ur: NEGATIVE
Specific Gravity, Urine: 1.005 (ref 1.001–1.035)
pH: 7 (ref 5.0–8.0)

## 2022-06-24 LAB — VITAMIN D 25 HYDROXY (VIT D DEFICIENCY, FRACTURES): Vit D, 25-Hydroxy: 85 ng/mL (ref 30–100)

## 2022-06-24 LAB — MICROALBUMIN / CREATININE URINE RATIO
Creatinine, Urine: 19 mg/dL — ABNORMAL LOW (ref 20–275)
Microalb, Ur: <0.2 mg/dL

## 2022-06-24 LAB — MAGNESIUM: Magnesium: 2.3 mg/dL (ref 1.5–2.5)

## 2022-06-24 LAB — INSULIN, RANDOM: Insulin: 8.3 u[IU]/mL

## 2022-06-24 LAB — TSH: TSH: 2.7 mIU/L (ref 0.40–4.50)

## 2022-06-24 NOTE — Progress Notes (Signed)
^<^<^<^<^<^<^<^<^<^<^<^<^<^<^<^<^<^<^<^<^<^<^<^<^<^<^<^<^<^<^<^<^<^<^<^<^ ^>^>^>^>^>^>^>^>^>^>^>^>^>^>^>^>^>^>^>^>^>^>^>^>^>^>^>^>^>^>^>^>^>^>^>^>^ -   Test results slightly outside the reference range are not unusual. If there is anything important, I will review this with you,  otherwise it is considered normal test values.  If you have further questions,  please do not hesitate to contact me at the office or via My Chart.  ^<^<^<^<^<^<^<^<^<^<^<^<^<^<^<^<^<^<^<^<^<^<^<^<^<^<^<^<^<^<^<^<^<^<^<^<^ ^>^>^>^>^>^>^>^>^>^>^>^>^>^>^>^>^>^>^>^>^>^>^>^>^>^>^>^>^>^>^>^>^>^>^>^>^  - Total Chol  sl elevated =, but  OK  since have such a high Good HDL level                                                                                                                                                                                       ^<^<^<^<^<^<^<^<^<^<^<^<^<^<^<^<^<^<^<^<^<^<^<^<^<^<^<^<^<^<^<^<^<^<^<^<^ ^>^>^>^>^>^>^>^>^>^>^>^>^>^>^>^>^>^>^>^>^>^>^>^>^>^>^>^>^>^>^>^>^>^>^>^>^  - Vitamin D = 85  - Excellent - Please keep dosage  same ^<^<^<^<^<^<^<^<^<^<^<^<^<^<^<^<^<^<^<^<^<^<^<^<^<^<^<^<^<^<^<^<^<^<^<^<^ ^>^>^>^>^>^>^>^>^>^>^>^>^>^>^>^>^>^>^>^>^>^>^>^>^>^>^>^>^>^>^>^>^>^>^>^>^  - All Else - CBC - Kidneys - Electrolytes - Liver - Magnesium & Thyroid    - all  Normal / OK ^<^<^<^<^<^<^<^<^<^<^<^<^<^<^<^<^<^<^<^<^<^<^<^<^<^<^<^<^<^<^<^<^<^<^<^<^ ^>^>^>^>^>^>^>^>^>^>^>^>^>^>^>^>^>^>^>^>^>^>^>^>^>^>^>^>^>^>^>^>^>^>^>^>^  -

## 2022-08-23 ENCOUNTER — Other Ambulatory Visit: Payer: Self-pay | Admitting: Internal Medicine

## 2022-08-23 DIAGNOSIS — E782 Mixed hyperlipidemia: Secondary | ICD-10-CM

## 2022-08-23 MED ORDER — EZETIMIBE 10 MG PO TABS: ORAL_TABLET | ORAL | 3 refills | Status: DC

## 2022-08-24 ENCOUNTER — Other Ambulatory Visit: Payer: Self-pay

## 2022-08-24 MED ORDER — METRONIDAZOLE 0.75 % EX CREA: TOPICAL_CREAM | CUTANEOUS | 10 refills | Status: AC

## 2022-11-18 DIAGNOSIS — Z23 Encounter for immunization: Secondary | ICD-10-CM | POA: Diagnosis not present

## 2022-11-30 DIAGNOSIS — Z1231 Encounter for screening mammogram for malignant neoplasm of breast: Secondary | ICD-10-CM | POA: Diagnosis not present

## 2022-12-23 DIAGNOSIS — Z23 Encounter for immunization: Secondary | ICD-10-CM | POA: Diagnosis not present

## 2023-01-19 NOTE — Progress Notes (Unsigned)
MEDICARE ANNUAL WELLNESS VISIT AND CPE  Assessment:  Encounter for Medicare annual wellness exam  Due Annually  Health Maintenance Reviewed  Healthy lifestyle reviewed and goals set   Seasonal allergic rhinitis due to pollen Continue OTC allergy pills Get on nasal spray  Osteoporosis, unspecified osteoporosis type, unspecified pathological fracture presence -    Continue follow up, calcium supplementation and weight bearing exercises  Vitamin D deficiency Continue supplement  Elevated BP without diagnosis of hypertension - continue medications, DASH diet, exercise and monitor at home. Call if greater than 130/80.  -     CBC with Differential/Platelet -     CMP  Hyperlipidemia, unspecified hyperlipidemia type -continue medications, check lipids, decrease fatty foods, increase activity.  -     Lipid panel  Medication management Magnesium  Abnormal Glucose Continue diet and exercise - CMP   Over 30 minutes of exam, counseling, chart review and critical decision making was performed Future Appointments  Date Time Provider Department Center  01/20/2023  9:30 AM Raynelle Dick, NP [facility] None  07/19/2023  2:00 PM [name], MD [facility] None  01/20/2024 10:00 AM Raynelle Dick, NP [facility] None     Plan:   During the course of the visit the patient was educated and counseled about appropriate screening and preventive services including:   Pneumococcal vaccine  Prevnar 13 Influenza vaccine Td vaccine Screening electrocardiogram Bone densitometry screening Colorectal cancer screening Diabetes screening Glaucoma screening Nutrition counseling  Advanced directives: requested   Subjective:  Sheila Gray is a 72 y.o. female who presents for Medicare Annual Wellness Visit and complete physical.    She is cooking a lot, baking a lot  Has not been having to take care of her sister in law as much  Her blood pressure has been controlled at home, today  their BP is   BP Readings from Last 3 Encounters:  06/23/22 118/78  01/29/22 122/78  07/08/21 118/72    She does workout. She denies chest pain, shortness of breath, dizziness.   She is seeing Dr. Reece Agar for osteoporosis, doing osteostrong and exercising. Had DEXA 09/2021 and it was stable.   BMI is There is no height or weight on file to calculate BMI., she is working on diet and exercise. Has stopped making warm bread, continues to exercise.  Wt Readings from Last 3 Encounters:  06/23/22 124 lb 9.6 oz (56.5 kg)  01/29/22 123 lb (55.8 kg)  07/08/21 120 lb 12.8 oz (54.8 kg)   She is on cholesterol medication , Zetia and denies myalgias. Her cholesterol is at goal. The cholesterol last visit was:   Lab Results  Component Value Date   CHOL 216 (H) 06/23/2022   HDL 99 06/23/2022   LDLCALC 91 06/23/2022   TRIG 161 (H) 06/23/2022   CHOLHDL 2.2 06/23/2022   Last W0J Lab Results  Component Value Date   HGBA1C 5.5 06/23/2022   Last GFR: Lab Results  Component Value Date   EGFR 62 06/23/2022    Patient is on Vitamin D supplement.   Lab Results  Component Value Date   VD25OH 30 06/23/2022      Medication Review: Current Outpatient Medications on File Prior to Visit  Medication Sig Dispense Refill   aspirin EC 81 MG tablet Take 81 mg by mouth daily.     Calcium Citrate-Vitamin D (CALCIUM CITRATE + D3 PO) Take 4 tablets daily by mouth.      Cholecalciferol (VITAMIN D PO) Take 5,000 Units by  mouth daily.     ezetimibe (ZETIA) 10 MG tablet Take  1 tablet  Daily for Cholesterol                               /                                                                   TAKE                                         BY                                                 MOUTH 90 tablet 3   Famotidine (PEPCID PO) Take 20 mg by mouth 2 (two) times daily.     fexofenadine (ALLEGRA) 180 MG tablet Take 180 mg by mouth daily.     LUTEIN PO Take 1 capsule by mouth daily.     metroNIDAZOLE  (METROCREAM) 0.75 % cream APPLY TO FACE AT BEDTIME 45 g 10   Multiple Vitamin (MULTIVITAMIN) tablet Take 1 tablet by mouth daily.     Probiotic Product (PROBIOTIC PO) Take by mouth.     Zinc 25 MG TABS Take by mouth.     No current facility-administered medications on file prior to visit.    Allergies  Allergen Reactions   Amoxicillin Rash    Current Problems (verified) Patient Active Problem List   Diagnosis Date Noted   Gastroesophageal reflux disease without esophagitis 09/12/2020   Elevated BP without diagnosis of hypertension 06/19/2015   Hyperlipidemia 06/19/2015   Medication management 06/19/2015   Allergic rhinitis 04/10/2013   Osteoporosis 04/10/2013   Vitamin D deficiency 04/10/2013    Screening Tests Immunization History  Administered Date(s) Administered   Influenza, High Dose Seasonal PF 12/20/2018, 12/25/2019, 11/28/2021   Influenza,inj,Quad PF,6+ Mos 12/14/2016   Influenza-Unspecified 11/29/2013, 12/09/2015, 12/01/2017, 12/18/2020   Moderna Covid-19 Vaccine Bivalent Booster 17yrs & up 12/01/2020   Moderna Sars-Covid-2 Vaccination 04/14/2019, 05/15/2019, 01/05/2020   PFIZER Comirnaty(Gray Top)Covid-19 Tri-Sucrose Vaccine 12/01/2021   PPD Test 11/09/2013   Pfizer Covid-19 Vaccine Bivalent Booster 23yrs & up 12/01/2021   Pneumococcal Conjugate-13 03/29/2014   Pneumococcal Polysaccharide-23 01/09/2016   Td 05/14/2005, 12/12/2015   Zoster Recombinant(Shingrix) 05/22/2020, 07/24/2020   Zoster, Live 11/28/2009   Health Maintenance  Topic Date Due   INFLUENZA VACCINE  10/15/2022   COVID-19 Vaccine (6 - 2023-24 season) 11/15/2022   MAMMOGRAM  11/25/2022   Medicare Annual Wellness (AWV)  01/30/2023   DEXA SCAN  11/25/2023   Colonoscopy  05/16/2024   DTaP/Tdap/Td (3 - Tdap) 12/11/2025   Pneumonia Vaccine 32+ Years old  Completed   Hepatitis C Screening  Completed   Zoster Vaccines- Shingrix  Completed   HPV VACCINES  Aged Out   Preventative care: Last  colonoscopy: 05/16/21 DR Marina Goodell due in 5 years Last mammogram: 09/25/21 Last pap smear/pelvic exam: never abnormal pap 2017 DEXA: 09/25/21 Dr. Reece Agar -3.2  did not get worse. Had done Osteostrong which does exercises focused for osteoporosis  Names of Other Physician/Practitioners you currently use: 1. [facility] here for primary care 2. [facility], eye doctor, last visit 07/11/21 3.Dr Yancey Flemings, dentist, last visit appt 11/11/21 Patient Care Team: [name], MD as PCP - General (Internal Medicine) Louis Meckel, MD (Inactive) as Consulting Physician (Gastroenterology) Love, Genene Churn, MD as Consulting Physician (Neurology) Levitin, Demetria Pore, MD (Rheumatology) Janalyn Harder, MD (Inactive) as Consulting Physician (Dermatology) Valeria Batman, MD (Inactive) as Consulting Physician (Orthopedic Surgery) Miguel Aschoff, MD (Inactive) as Consulting Physician (Obstetrics and Gynecology)  SURGICAL HISTORY She  has no past surgical history on file. FAMILY HISTORY Her family history includes Heart disease in her father; Hypertension in her mother; Kidney disease in her father. SOCIAL HISTORY She  reports that she has never smoked. She has never used smokeless tobacco. She reports current alcohol use of about 3.0 standard drinks of alcohol per week. She reports that she does not use drugs.   MEDICARE WELLNESS OBJECTIVES: Physical activity:   Cardiac risk factors:   Depression/mood screen:      01/29/2022   11:16 AM  Depression screen PHQ 2/9  Decreased Interest 0  Down, Depressed, Hopeless 0  PHQ - 2 Score 0    ADLs:     01/29/2022   11:16 AM  In your present state of health, do you have any difficulty performing the following activities:  Hearing? 0  Vision? 0  Difficulty concentrating or making decisions? 0  Walking or climbing stairs? 0  Dressing or bathing? 0  Doing errands, shopping? 0     Cognitive Testing  Alert? Yes  Normal  Appearance?Yes  Oriented to person? Yes  Place? Yes   Time? Yes  Recall of three objects?  Yes  Can perform simple calculations? Yes  Displays appropriate judgment?Yes  Can read the correct time from a watch face?Yes  EOL planning:    Review of Systems  Constitutional: Negative.  Negative for chills, fever and weight loss.  HENT: Negative.  Negative for congestion and hearing loss.   Eyes: Negative.  Negative for blurred vision and double vision.  Respiratory:  Negative for cough, hemoptysis, sputum production, shortness of breath and wheezing.   Cardiovascular: Negative.  Negative for chest pain, palpitations, orthopnea and leg swelling.  Gastrointestinal: Negative.  Negative for abdominal pain, constipation, diarrhea, heartburn, nausea and vomiting.  Genitourinary: Negative.   Musculoskeletal: Negative.  Negative for falls, joint pain and myalgias.  Skin: Negative.  Negative for rash.  Neurological: Negative.  Negative for dizziness, tingling, tremors, loss of consciousness and headaches.  Endo/Heme/Allergies: Negative.   Psychiatric/Behavioral: Negative.  Negative for depression, memory loss and suicidal ideas.      Objective:     There were no vitals filed for this visit.  There is no height or weight on file to calculate BMI.  General appearance: alert, no distress, WD/WN, female HEENT: normocephalic, sclerae anicteric, TMs pearly, nares patent, no discharge or erythema, pharynx normal Oral cavity: MMM, no lesions Neck: supple, no lymphadenopathy, no thyromegaly, no masses Heart: RRR, normal S1, S2, no murmurs Lungs: CTA bilaterally, no wheezes, rhonchi, or rales Abdomen: +bs, soft, non tender, non distended, no masses, no hepatomegaly, no splenomegaly Musculoskeletal: nontender, no swelling, no obvious deformity Extremities: no edema, no cyanosis, no clubbing Pulses: 2+ symmetric, upper and lower extremities, normal cap refill Neurological: alert, oriented x 3, CN2-12  intact, strength normal upper extremities  and lower extremities, sensation normal throughout, DTRs 2+ throughout, no cerebellar signs, gait normal Psychiatric: normal affect, behavior normal, pleasant    Medicare Attestation I have personally reviewed: The patient's medical and social history Their use of alcohol, tobacco or illicit drugs Their current medications and supplements The patient's functional ability including ADLs,fall risks, home safety risks, cognitive, and hearing and visual impairment Diet and physical activities Evidence for depression or mood disorders  The patient's weight, height, BMI, and visual acuity have been recorded in the chart.  I have made referrals, counseling, and provided education to the patient based on review of the above and I have provided the patient with a written personalized care plan for preventive services.     Raynelle Dick, NP   01/19/2023

## 2023-01-20 ENCOUNTER — Ambulatory Visit (INDEPENDENT_AMBULATORY_CARE_PROVIDER_SITE_OTHER): Payer: Medicare Other | Admitting: Nurse Practitioner

## 2023-01-20 ENCOUNTER — Encounter: Payer: Self-pay | Admitting: Nurse Practitioner

## 2023-01-20 VITALS — BP 112/70 | HR 83 | Temp 97.7°F | Ht 61.0 in | Wt 125.2 lb

## 2023-01-20 DIAGNOSIS — E559 Vitamin D deficiency, unspecified: Secondary | ICD-10-CM

## 2023-01-20 DIAGNOSIS — Z0000 Encounter for general adult medical examination without abnormal findings: Secondary | ICD-10-CM

## 2023-01-20 DIAGNOSIS — E782 Mixed hyperlipidemia: Secondary | ICD-10-CM | POA: Diagnosis not present

## 2023-01-20 DIAGNOSIS — Z79899 Other long term (current) drug therapy: Secondary | ICD-10-CM

## 2023-01-20 DIAGNOSIS — R030 Elevated blood-pressure reading, without diagnosis of hypertension: Secondary | ICD-10-CM

## 2023-01-20 DIAGNOSIS — Z789 Other specified health status: Secondary | ICD-10-CM

## 2023-01-20 DIAGNOSIS — R6889 Other general symptoms and signs: Secondary | ICD-10-CM | POA: Diagnosis not present

## 2023-01-20 DIAGNOSIS — M810 Age-related osteoporosis without current pathological fracture: Secondary | ICD-10-CM | POA: Diagnosis not present

## 2023-01-20 DIAGNOSIS — Z0001 Encounter for general adult medical examination with abnormal findings: Secondary | ICD-10-CM

## 2023-01-20 DIAGNOSIS — R7309 Other abnormal glucose: Secondary | ICD-10-CM | POA: Diagnosis not present

## 2023-01-20 DIAGNOSIS — K219 Gastro-esophageal reflux disease without esophagitis: Secondary | ICD-10-CM | POA: Diagnosis not present

## 2023-01-20 NOTE — Patient Instructions (Signed)

## 2023-01-21 LAB — COMPLETE METABOLIC PANEL WITH GFR
AG Ratio: 1.8 (calc) (ref 1.0–2.5)
ALT: 22 U/L (ref 6–29)
AST: 27 U/L (ref 10–35)
Albumin: 4.7 g/dL (ref 3.6–5.1)
Alkaline phosphatase (APISO): 59 U/L (ref 37–153)
BUN: 16 mg/dL (ref 7–25)
CO2: 30 mmol/L (ref 20–32)
Calcium: 10.3 mg/dL (ref 8.6–10.4)
Chloride: 100 mmol/L (ref 98–110)
Creat: 0.92 mg/dL (ref 0.60–1.00)
Globulin: 2.6 g/dL (ref 1.9–3.7)
Glucose, Bld: 80 mg/dL (ref 65–99)
Potassium: 5.1 mmol/L (ref 3.5–5.3)
Sodium: 139 mmol/L (ref 135–146)
Total Bilirubin: 0.6 mg/dL (ref 0.2–1.2)
Total Protein: 7.3 g/dL (ref 6.1–8.1)
eGFR: 66 mL/min/{1.73_m2} (ref >=60)

## 2023-01-21 LAB — CBC WITH DIFFERENTIAL/PLATELET
Absolute Lymphocytes: 3494 {cells}/uL (ref 850–3900)
Absolute Monocytes: 546 {cells}/uL (ref 200–950)
Basophils Absolute: 50 {cells}/uL (ref 0–200)
Basophils Relative: 0.6 %
Eosinophils Absolute: 328 {cells}/uL (ref 15–500)
Eosinophils Relative: 3.9 %
HCT: 47 % — ABNORMAL HIGH (ref 35.0–45.0)
Hemoglobin: 15.3 g/dL (ref 11.7–15.5)
MCH: 30.5 pg (ref 27.0–33.0)
MCHC: 32.6 g/dL (ref 32.0–36.0)
MCV: 93.8 fL (ref 80.0–100.0)
MPV: 9 fL (ref 7.5–12.5)
Monocytes Relative: 6.5 %
Neutro Abs: 3982 {cells}/uL (ref 1500–7800)
Neutrophils Relative %: 47.4 %
Platelets: 269 10*3/uL (ref 140–400)
RBC: 5.01 10*6/uL (ref 3.80–5.10)
RDW: 12.5 % (ref 11.0–15.0)
Total Lymphocyte: 41.6 %
WBC: 8.4 10*3/uL (ref 3.8–10.8)

## 2023-01-21 LAB — LIPID PANEL
Cholesterol: 227 mg/dL — ABNORMAL HIGH (ref <200)
HDL: 109 mg/dL (ref >=50)
LDL Cholesterol (Calc): 92 mg/dL
Non-HDL Cholesterol (Calc): 118 mg/dL (ref <130)
Total CHOL/HDL Ratio: 2.1 (calc) (ref <5.0)
Triglycerides: 163 mg/dL — ABNORMAL HIGH (ref <150)

## 2023-01-21 LAB — MAGNESIUM: Magnesium: 2.2 mg/dL (ref 1.5–2.5)

## 2023-06-23 ENCOUNTER — Encounter: Payer: Medicare Other | Admitting: Internal Medicine

## 2023-06-24 ENCOUNTER — Other Ambulatory Visit: Payer: Self-pay | Admitting: Family Medicine

## 2023-06-24 DIAGNOSIS — E782 Mixed hyperlipidemia: Secondary | ICD-10-CM

## 2023-06-24 NOTE — Telephone Encounter (Signed)
 Copied from CRM [id number]. Topic: Clinical - Medication Refill >> Jun 24, 2023  9:33 AM Drema Balzarine wrote: Most Recent Primary Care Visit:   Medication: ezetimibe  metroNIDAZOLE    Has the patient contacted their pharmacy? Yes (Agent: If no, request that the patient contact the pharmacy for the refill. If patient does not wish to contact the pharmacy document the reason why and proceed with request.) (Agent: If yes, when and what did the pharmacy advise?)  Is this the correct pharmacy for this prescription? Yes If no, delete pharmacy and type the correct one.  This is the patient's preferred pharmacy:   [facility] [id number] [place], [place] - [address] [place] [address] Phone: [phone] Fax: [phone]   Has the prescription been filled recently? Yes  Is the patient out of the medication? No  Has the patient been seen for an appointment in the last year OR does the patient have an upcoming appointment? No  Can we respond through MyChart? No  Agent: Please be advised that Rx refills may take up to 3 business days. We ask that you follow-up with your pharmacy.

## 2023-06-26 IMAGING — CR DG CHEST 2V
2 series · 2 of 2 positions shown · non-contrast
Comparison: PA Lat 05/24/2018.

CLINICAL DATA: Coughing congestion for 2 months.

EXAM:
CHEST - 2 VIEW

[w chest pa]
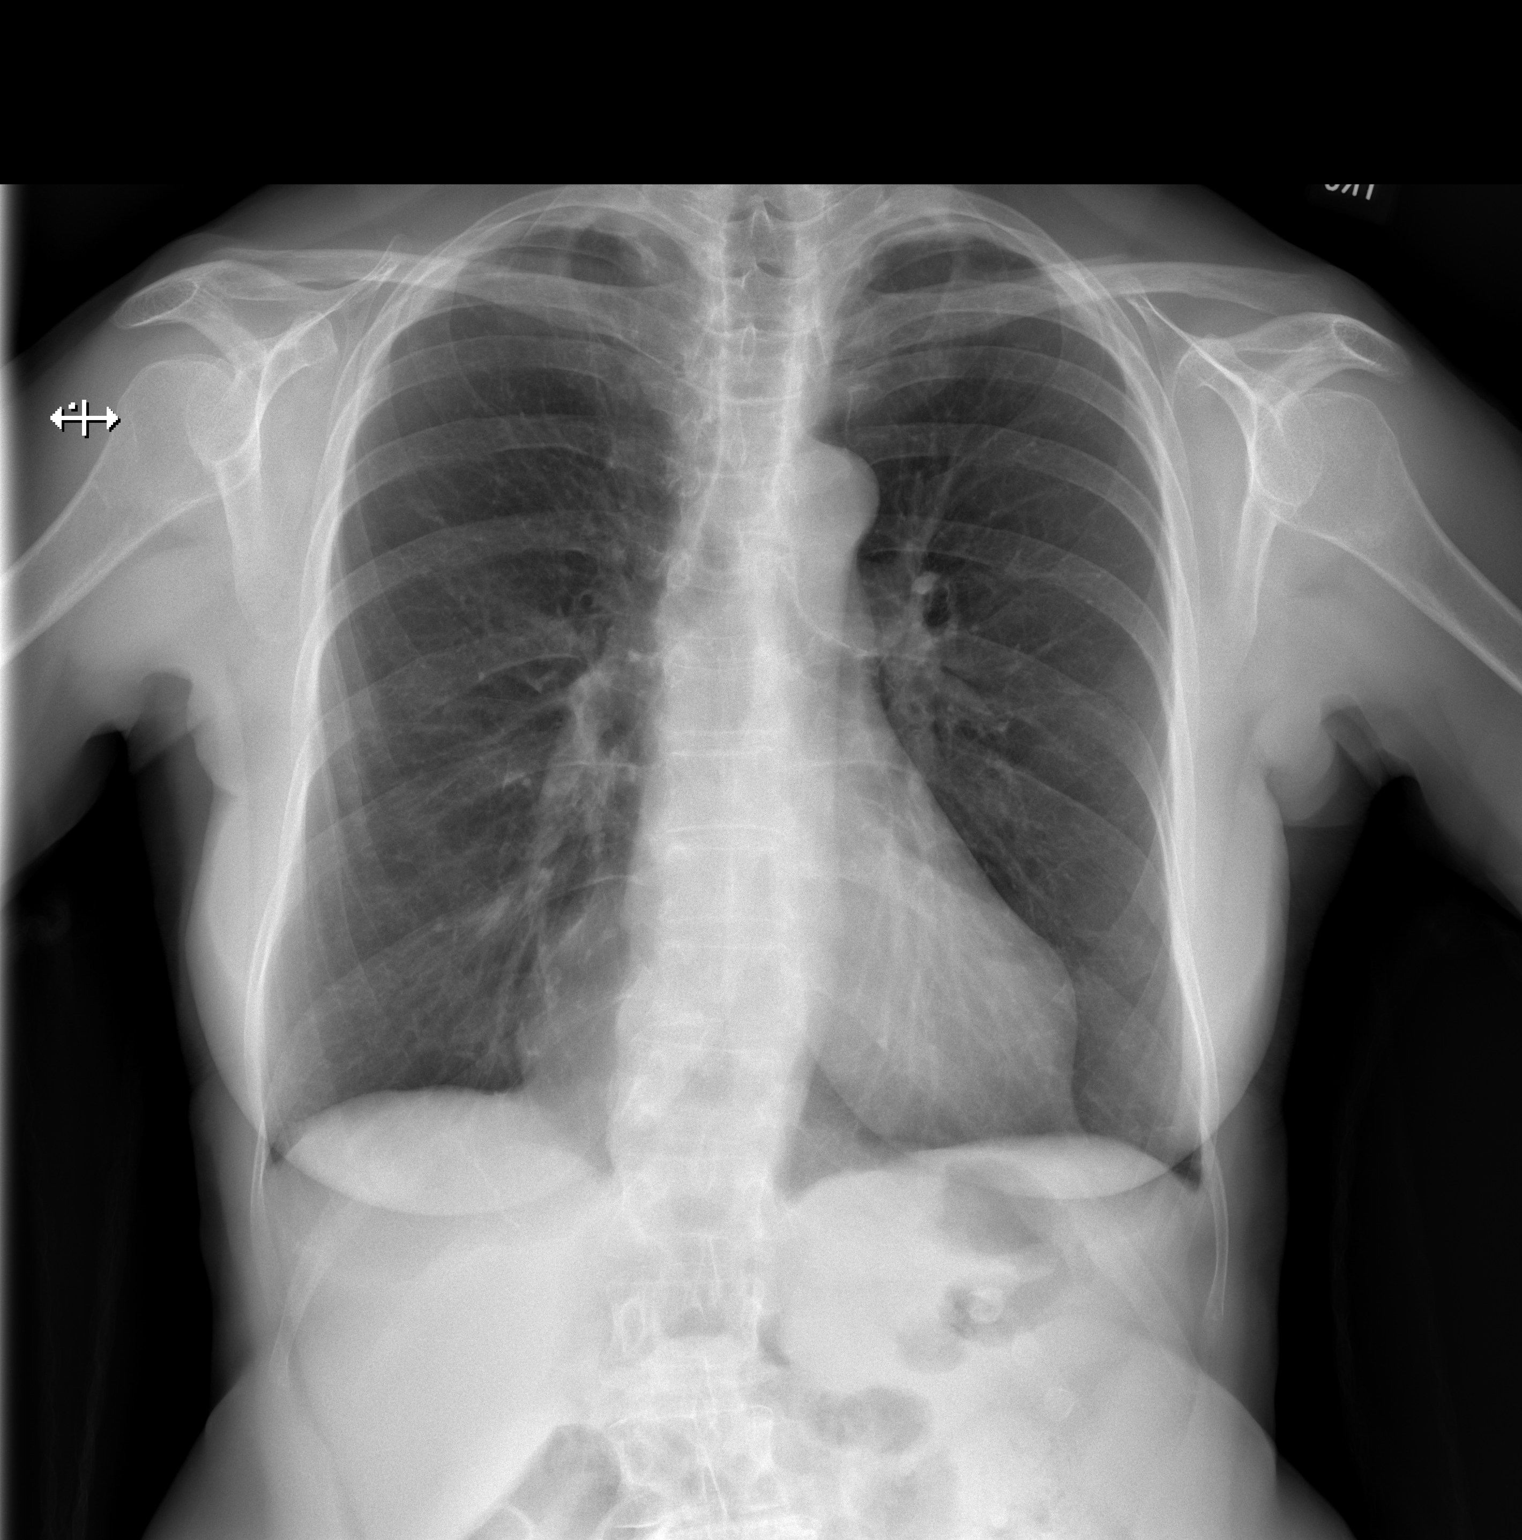

[w chest lat]
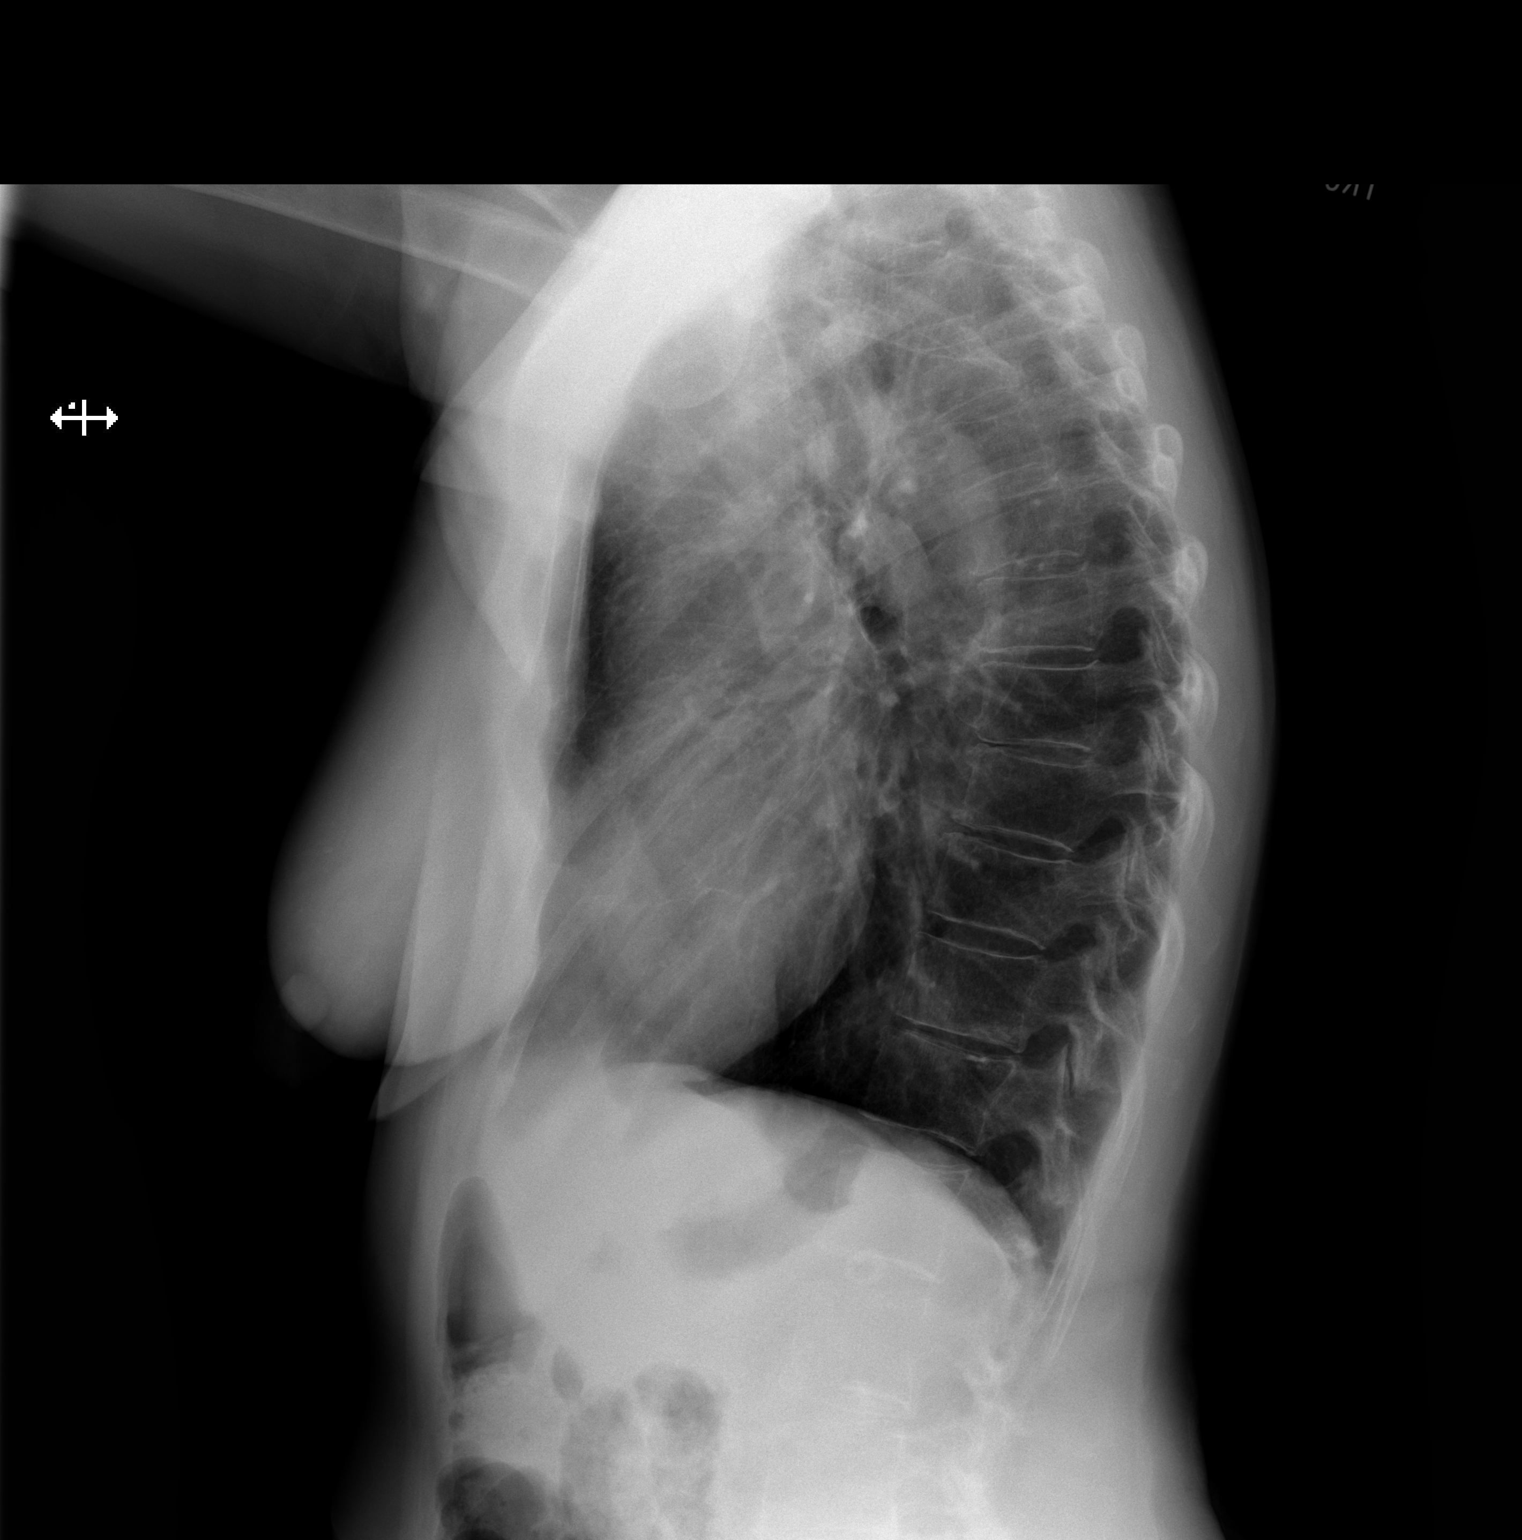

[2 of 2 positions shown; findings below may reference images not displayed]

FINDINGS: The lungs are hyperinflated but clear. Heart size and vasculature
are normal.

Stable mediastinal configuration noted with prominent right
cardiophrenic angle fat pad unchanged.

The sulci are sharp. Thoracic cage is intact with osteopenia and
thoracic spondylosis, mild kyphosis.
IMPRESSION: No active cardiopulmonary disease.  Stable hyperinflated chest.

## 2023-07-07 ENCOUNTER — Encounter: Payer: Medicare Other | Admitting: Internal Medicine

## 2023-07-15 ENCOUNTER — Encounter: Payer: Self-pay | Admitting: Student in an Organized Health Care Education/Training Program

## 2023-07-15 ENCOUNTER — Ambulatory Visit (INDEPENDENT_AMBULATORY_CARE_PROVIDER_SITE_OTHER): Admitting: Student in an Organized Health Care Education/Training Program

## 2023-07-15 VITALS — BP 116/80 | HR 65 | Ht 60.63 in | Wt 125.0 lb

## 2023-07-15 DIAGNOSIS — M810 Age-related osteoporosis without current pathological fracture: Secondary | ICD-10-CM

## 2023-07-15 DIAGNOSIS — J301 Allergic rhinitis due to pollen: Secondary | ICD-10-CM

## 2023-07-15 DIAGNOSIS — E782 Mixed hyperlipidemia: Secondary | ICD-10-CM | POA: Diagnosis not present

## 2023-07-15 DIAGNOSIS — E785 Hyperlipidemia, unspecified: Secondary | ICD-10-CM

## 2023-07-15 MED ORDER — EZETIMIBE 10 MG PO TABS: ORAL_TABLET | ORAL | 3 refills | Status: AC

## 2023-07-15 MED ORDER — FLUTICASONE PROPIONATE 50 MCG/ACT NA SUSP: 2.0000 | Freq: Every day | NASAL | 6 refills | Status: AC

## 2023-07-15 NOTE — Assessment & Plan Note (Signed)
 Chronic and stable.  Last DEXA scan was in 2023 showing femoral neck T-scores of -3.  10-year risk of hip fracture around 6%.  Patient declined medication assisted treatment of osteoporosis due to risk of side effects.  Currently she is using calcium, vitamin D , and strength training.

## 2023-07-15 NOTE — Assessment & Plan Note (Signed)
 Chronic and worsened recently in this pollen season.  Causes her postnasal drip which causes some voice changes.  We talked about adding sinus irrigation with normal saline once daily.  I recommended adding intranasal fluticasone  once daily.  Continue with Allegra once daily.

## 2023-07-15 NOTE — Progress Notes (Signed)
 New Patient Office Visit  Subjective    Patient ID: Sheila Gray, female    DOB: [birth year]  Age: 73 y.o. MRN: [id number]  CC:   Chief Complaint  Patient presents with   Establish Care    Patient states she does have an issue with a disc in her lower back that came about 9 years ago. Patient does try to be cautious with her back.      HPI  Sheila Gray presents to establish care  73 year old person, former patient of Dr. Cassondra Cliff, and overall a very healthy person.  She came to the clinic today with her husband who is a practicing ophthalmologist.  She is independent, functional in all activities of daily living, excellent exercise capacity, active in [employer].  She reports being in good health, no recent hospitalizations, no history of surgeries.  She takes only a few medications, overall being hesitance because of risk of side effects and the low value of most medications for someone as healthy as she is.  She walks about 3 to 4 miles per day, denies any chest pain, shortness of breath, or joint pain.  She has a history of intermittent low back pain with some degenerative disc disease.  This has not bothered her in many months.  She has a history of osteoporosis, not currently on treatment, no history of fragility fractures.  Denies any history of anxious mood, depressed mood.  Eats a healthy diet.    Outpatient Encounter Medications as of 07/15/2023  Medication Sig   Calcium Citrate-Vitamin D  (CALCIUM CITRATE + D3 PO) Take 4 tablets daily by mouth.    Cholecalciferol (VITAMIN D  PO) Take 5,000 Units by mouth daily.   Famotidine (PEPCID PO) Take 20 mg by mouth daily.   fexofenadine (ALLEGRA) 180 MG tablet Take 180 mg by mouth daily.   fluticasone  (FLONASE ) 50 MCG/ACT nasal spray Place 2 sprays into both nostrils daily.   metroNIDAZOLE  (METROCREAM ) 0.75 % cream APPLY TO FACE AT BEDTIME   [DISCONTINUED] aspirin EC 81 MG tablet Take 81 mg by mouth daily.   [DISCONTINUED]  ezetimibe  (ZETIA ) 10 MG tablet Take  1 tablet  Daily for Cholesterol                               /                                                                   TAKE                                         BY                                                 MOUTH   [DISCONTINUED] LUTEIN PO Take 1 capsule by mouth daily.   [DISCONTINUED] Menaquinone-7 (K2 PO) Take 100 mcg by mouth daily.   [DISCONTINUED] Multiple Vitamin (MULTIVITAMIN) tablet Take 1 tablet by mouth daily.   [  DISCONTINUED] Probiotic Product (PROBIOTIC PO) Take by mouth.   [DISCONTINUED] Zinc 25 MG TABS Take by mouth.   ezetimibe  (ZETIA ) 10 MG tablet Take  1 tablet  Daily for Cholesterol                               /                                                                   TAKE                                         BY                                                 MOUTH   No facility-administered encounter medications on file as of 07/15/2023.    Past Medical History:  Diagnosis Date   Allergy    GERD (gastroesophageal reflux disease)    Hyperlipidemia    Osteoporosis    Vitamin D  deficiency     History reviewed. No pertinent surgical history.  Family History  Problem Relation Age of Onset   Hypertension Mother    Hearing loss Mother    Kidney disease Father    Heart disease Father    Cancer Sister    Cancer Maternal Aunt    Colon cancer Neg Hx    Esophageal cancer Neg Hx    Rectal cancer Neg Hx    Stomach cancer Neg Hx     Social History   Socioeconomic History   Marital status: Married    Spouse name: Not on file   Number of children: Not on file   Years of education: Not on file   Highest education level: Bachelor's degree (e.g., BA, AB, BS)  Occupational History   Not on file  Tobacco Use   Smoking status: Never   Smokeless tobacco: Never  Substance and Sexual Activity   Alcohol use: Yes    Alcohol/week: 3.0 standard drinks of alcohol    Types: 1 Glasses of wine, 2 Standard drinks  or equivalent per week    Comment: cocktail one a week   Drug use: No   Sexual activity: Yes  Other Topics Concern   Not on file  Social History Narrative   Not on file   Social Drivers of Health   Financial Resource Strain: Low Risk  (07/11/2023)   Overall Financial Resource Strain (CARDIA)    Difficulty of Paying Living Expenses: Not hard at all  Food Insecurity: No Food Insecurity (07/11/2023)   Hunger Vital Sign    Worried About Running Out of Food in the Last Year: Never true    Ran Out of Food in the Last Year: Never true  Transportation Needs: No Transportation Needs (07/11/2023)   PRAPARE - Administrator, Civil Service (Medical): No    Lack of Transportation (Non-Medical): No  Physical Activity: Sufficiently Active (07/11/2023)   Exercise Vital Sign  Days of Exercise per Week: 6 days    Minutes of Exercise per Session: 50 min  Stress: No Stress Concern Present (07/11/2023)   [employer] of Occupational Health - Occupational Stress Questionnaire    Feeling of Stress : Only a little  Social Connections: Socially Integrated (07/11/2023)   Social Connection and Isolation Panel [NHANES]    Frequency of Communication with Friends and Family: More than three times a week    Frequency of Social Gatherings with Friends and Family: More than three times a week    Attends Religious Services: More than 4 times per year    Active Member of [employer] or Organizations: Yes    Attends Engineer, structural: More than 4 times per year    Marital Status: Married  Catering manager Violence: Not on file        Objective    BP 116/80   Pulse 65   Ht 5' 0.63" (1.54 m)   Wt 125 lb (56.7 kg)   SpO2 99%   BMI 23.91 kg/m   Physical Exam  Gen: Well-appearing woman Eyes: Normal Ears: Normal tympanic membranes bilaterally Neck: Normal thyroid , no nodules or adenopathy Heart: Regular, 2 out of 6 early systolic murmur best heard at the right upper sternal  border Lungs: Unlabored, clear to station throughout Ext: Warm, no edema, normal joints Psych: Very pleasant to talk with, not anxious or depressed. Skin: Nails on her right hand have some pitting which she attributes to picking at them, no signs of psoriasis or plaques on her skin elsewhere.  Nails on the left hand are normal Neuro: Alert, conversational, full strength upper and lower extremities, normal gait      Assessment & Plan:   Problem List Items Addressed This Visit       Medium    Osteoporosis (Chronic)   Chronic and stable.  Last DEXA scan was in 2023 showing femoral neck T-scores of -3.  10-year risk of hip fracture around 6%.  Patient declined medication assisted treatment of osteoporosis due to risk of side effects.  Currently she is using calcium, vitamin D , and strength training.        Low   Allergic rhinitis - Primary (Chronic)   Chronic and worsened recently in this pollen season.  Causes her postnasal drip which causes some voice changes.  We talked about adding sinus irrigation with normal saline once daily.  I recommended adding intranasal fluticasone  once daily.  Continue with Allegra once daily.      Relevant Medications   fluticasone  (FLONASE ) 50 MCG/ACT nasal spray   Hyperlipidemia (Chronic)   Chronic and stable.  10-year ASCVD risk is 12%.  Patient wants to avoid statin medication because of risk of side effects.  Currently using ezetimibe  10 mg daily which we can continue.  Not having side effects to this medicine.  LDL goal is around 100, which she is doing well with this medication.      Relevant Medications   ezetimibe  (ZETIA ) 10 MG tablet   Other Visit Diagnoses       Hyperlipidemia, mixed       Relevant Medications   ezetimibe  (ZETIA ) 10 MG tablet       Return in about 6 months (around 01/15/2024).   Ether Hercules, MD

## 2023-07-15 NOTE — Assessment & Plan Note (Signed)
 Chronic and stable.  10-year ASCVD risk is 12%.  Patient wants to avoid statin medication because of risk of side effects.  Currently using ezetimibe  10 mg daily which we can continue.  Not having side effects to this medicine.  LDL goal is around 100, which she is doing well with this medication.

## 2023-07-19 ENCOUNTER — Encounter: Payer: Medicare Other | Admitting: Internal Medicine

## 2023-10-01 ENCOUNTER — Encounter: Payer: Self-pay | Admitting: Advanced Practice Midwife

## 2023-10-06 DIAGNOSIS — Z01419 Encounter for gynecological examination (general) (routine) without abnormal findings: Secondary | ICD-10-CM | POA: Diagnosis not present

## 2023-12-01 DIAGNOSIS — M810 Age-related osteoporosis without current pathological fracture: Secondary | ICD-10-CM | POA: Diagnosis not present

## 2023-12-01 DIAGNOSIS — Z1231 Encounter for screening mammogram for malignant neoplasm of breast: Secondary | ICD-10-CM | POA: Diagnosis not present

## 2023-12-01 DIAGNOSIS — Z23 Encounter for immunization: Secondary | ICD-10-CM | POA: Diagnosis not present

## 2023-12-09 DIAGNOSIS — Z23 Encounter for immunization: Secondary | ICD-10-CM | POA: Diagnosis not present

## 2024-01-20 ENCOUNTER — Ambulatory Visit: Payer: Medicare Other | Admitting: Nurse Practitioner

## 2024-01-25 ENCOUNTER — Ambulatory Visit: Admitting: Student in an Organized Health Care Education/Training Program

## 2024-01-25 VITALS — BP 125/80 | HR 78 | Wt 125.2 lb

## 2024-01-25 DIAGNOSIS — I10 Essential (primary) hypertension: Secondary | ICD-10-CM | POA: Diagnosis not present

## 2024-01-25 DIAGNOSIS — E785 Hyperlipidemia, unspecified: Secondary | ICD-10-CM

## 2024-01-25 DIAGNOSIS — J301 Allergic rhinitis due to pollen: Secondary | ICD-10-CM | POA: Diagnosis not present

## 2024-01-25 DIAGNOSIS — M810 Age-related osteoporosis without current pathological fracture: Secondary | ICD-10-CM

## 2024-01-25 LAB — BASIC METABOLIC PANEL WITH GFR
BUN: 14 mg/dL (ref 6–23)
CO2: 28 meq/L (ref 19–32)
Calcium: 9.5 mg/dL (ref 8.4–10.5)
Chloride: 102 meq/L (ref 96–112)
Creatinine, Ser: 0.79 mg/dL (ref 0.40–1.20)
GFR: 73.51 mL/min (ref >60.00)
Glucose, Bld: 72 mg/dL (ref 70–99)
Potassium: 4.2 meq/L (ref 3.5–5.1)
Sodium: 140 meq/L (ref 135–145)

## 2024-01-25 LAB — MICROALBUMIN / CREATININE URINE RATIO
Creatinine,U: 44.4 mg/dL
Microalb Creat Ratio: UNDETERMINED mg/g (ref 0.0–30.0)
Microalb, Ur: <0.7 mg/dL

## 2024-01-25 LAB — LIPID PANEL
Cholesterol: 217 mg/dL — ABNORMAL HIGH (ref 0–200)
HDL: 89.3 mg/dL (ref >39.00)
LDL Cholesterol: 94 mg/dL (ref 0–99)
NonHDL: 127.8
Total CHOL/HDL Ratio: 2
Triglycerides: 169 mg/dL — ABNORMAL HIGH (ref 0.0–149.0)
VLDL: 33.8 mg/dL (ref 0.0–40.0)

## 2024-01-25 LAB — VITAMIN D 25 HYDROXY (VIT D DEFICIENCY, FRACTURES): VITD: 83.1 ng/mL (ref 30.00–100.00)

## 2024-01-25 NOTE — Assessment & Plan Note (Signed)
 Managed with Ezetimibe . Blood work is planned to assess cholesterol levels. Continue Ezetimibe  10 MG oral daily. Ordered blood work for cholesterol levels.  This is primary prevention.

## 2024-01-25 NOTE — Patient Instructions (Signed)
  VISIT SUMMARY: Today, you had a routine follow-up visit. You have been doing well since your last visit in May, with no new health concerns. You recently enjoyed a trip to [place] and have been maintaining an active lifestyle. Your blood pressure at home is good, and you are up to date with your health screenings. We discussed your current medications and made a few recommendations to enhance your treatment plan.  YOUR PLAN: -ADULT WELLNESS VISIT: This visit was a routine check-up to monitor your overall health. Your blood pressure was slightly elevated in the office but is normal at home. Your weight is within normal limits, and you are up to date on your screenings. We ordered blood work to check your cholesterol and kidney function. Please continue your active lifestyle and follow up in 6 months.  -ALLERGIC RHINITIS: Allergic rhinitis is a condition where your nasal passages become inflamed due to allergens. You are managing this with Fluticasone  and Fexofenadine. We discussed using sinus rinses to help your medications work better. Continue taking Fluticasone  50 MCG/ACT nasal daily and Fexofenadine 180 mg oral daily. Consider using a neti pot or sinus rinse bottle with saline.  -OSTEOPOROSIS: Osteoporosis is a condition where your bones become weak and brittle. You are managing this with calcium citrate-vitamin D  and cholecalciferol. There are no new fractures or significant bone pain. Continue taking Calcium citrate-vitamin D  4 tablets oral daily and Cholecalciferol 5000 Units oral daily.  -HYPERLIPIDEMIA: Hyperlipidemia is a condition where you have high levels of fats (lipids) in your blood. You are managing this with Ezetimibe . We ordered blood work to check your cholesterol levels. Continue taking Ezetimibe  10 MG oral daily.  INSTRUCTIONS: Please follow up in 6 months for your next routine check-up. Make sure to get your blood work done as ordered to assess your cholesterol and kidney  function.

## 2024-01-25 NOTE — Progress Notes (Signed)
 Established Patient Office Visit  Patient ID: Sheila Gray, female    DOB: 1950-12-06  Age: 73 y.o. MRN: [id number] PCP: Jerrell Cleatus Ned, MD  Chief Complaint  Patient presents with   Medical Management of Chronic Issues    6 month follow up     Subjective:     HPI  Discussed the use of AI scribe software for clinical note transcription with the patient, who gave verbal consent to proceed.  History of Present Illness Sheila Gray is a 73 year old female who presents for a routine follow-up visit.  She has no new concerns since her last visit in May. She has been well without any falls, illnesses, or need for emergency department visits or surgeries. She recently traveled to [place] and describes the experience as 'amazing'.  She is currently using Flonase  and fexofenadine for sinus issues. She has not been using sinus rinses. Her son-in-law, an allergist, may provide further recommendations.  Her blood pressure at home is typically around 125/80 mmHg. She checks her blood pressure regularly, especially since her husband has been monitoring his own due to his health concerns.  She is taking Zetia  and uses Trinidazole cream for her face. She reports no issues with her medications and does not require any refills at this time.  She maintains an active lifestyle, primarily walking and doing some relaxation exercises and stretching. She acknowledges not engaging in much strength training but remains active and social, which she finds beneficial for her well-being.  Her last colonoscopy in March 2023 revealed a 3 mm polyp and two 5 mm polyps, leading to a recommendation for a repeat procedure in five years. She is up to date with her mammograms and bone density tests.  Her weight is currently 125 pounds, which she notes is the heaviest she has been since pregnancy, but she feels it is manageable and part of maintaining her health. She emphasizes the importance of staying functional  and active.  No new lumps or skin issues, joints are holding up well, and her mood is good. No issues with her heart or breathing.     Objective:     BP 125/80   Pulse 78   Wt 125 lb 3.2 oz (56.8 kg)   BMI 23.95 kg/m    Physical Exam   Gen: Very well-appearing woman Neck: Normal thyroid , no nodules or adenopathy Heart: Regular, no murmur Lungs: Unlabored, clear throughout Ext: Warm, no edema, normal joints    Assessment & Plan:   Problem List Items Addressed This Visit       High   Hypertension   Initial mild elevation in blood pressure in the office.  Her blood pressure measurements at home have been much better.  Likely stage I hypertension.  I recommended continuing to monitor blood pressures at home 2-3 times weekly.  Goal blood pressure in her case would be less than 140/85.  Will check kidney function and urine microalbumin today.  Continue with lifestyle managements, low value to medications at this point I think.      Relevant Orders   Basic metabolic panel with GFR   Microalbumin / creatinine urine ratio     Medium    Osteoporosis (Chronic)   Managed with calcium citrate-vitamin D  and cholecalciferol. No new fractures or significant bone pain. Continue Calcium citrate-vitamin D  4 tablets oral daily and Cholecalciferol 5000 Units oral daily.  Patient declines the use of bisphosphonate therapy.      Relevant Orders  VITAMIN D  25 Hydroxy (Vit-D Deficiency, Fractures)     Low   Allergic rhinitis (Chronic)   Chronic allergic rhinitis is managed with Fluticasone  and Fexofenadine. Discussed sinus rinses to enhance medication efficacy. Continue Fluticasone  50 MCG/ACT nasal daily and Fexofenadine 180 mg oral daily. Recommended neti pot or sinus rinse bottle with saline.      Hyperlipidemia - Primary (Chronic)   Managed with Ezetimibe . Blood work is planned to assess cholesterol levels. Continue Ezetimibe  10 MG oral daily. Ordered blood work for cholesterol  levels.  This is primary prevention.      Relevant Orders   Lipid panel    Return in about 6 months (around 07/24/2024).    Cleatus Debby Specking, MD [facility]

## 2024-01-25 NOTE — Assessment & Plan Note (Signed)
 Chronic allergic rhinitis is managed with Fluticasone  and Fexofenadine. Discussed sinus rinses to enhance medication efficacy. Continue Fluticasone  50 MCG/ACT nasal daily and Fexofenadine 180 mg oral daily. Recommended neti pot or sinus rinse bottle with saline.

## 2024-01-25 NOTE — Assessment & Plan Note (Signed)
 Initial mild elevation in blood pressure in the office.  Her blood pressure measurements at home have been much better.  Likely stage I hypertension.  I recommended continuing to monitor blood pressures at home 2-3 times weekly.  Goal blood pressure in her case would be less than 140/85.  Will check kidney function and urine microalbumin today.  Continue with lifestyle managements, low value to medications at this point I think.

## 2024-01-25 NOTE — Assessment & Plan Note (Signed)
 Managed with calcium citrate-vitamin D  and cholecalciferol. No new fractures or significant bone pain. Continue Calcium citrate-vitamin D  4 tablets oral daily and Cholecalciferol 5000 Units oral daily.  Patient declines the use of bisphosphonate therapy.

## 2024-01-26 ENCOUNTER — Ambulatory Visit: Payer: Self-pay | Admitting: Student in an Organized Health Care Education/Training Program

## 2024-04-05 ENCOUNTER — Ambulatory Visit: Admitting: *Deleted

## 2024-04-05 VITALS — Ht 61.0 in | Wt 125.0 lb

## 2024-04-05 DIAGNOSIS — Z0000 Encounter for general adult medical examination without abnormal findings: Secondary | ICD-10-CM

## 2024-04-05 NOTE — Patient Instructions (Signed)
 Sheila Gray,  Thank you for taking the time for your Medicare Wellness Visit. I appreciate your continued commitment to your health goals. Please review the care plan we discussed, and feel free to reach out if I can assist you further.  Please note that Annual Wellness Visits do not include a physical exam. Some assessments may be limited, especially if the visit was conducted virtually. If needed, we may recommend an in-person follow-up with your provider.  Ongoing Care Seeing your primary care provider every 3 to 6 months helps us  monitor your health and provide consistent, personalized care.  Referrals If a referral was made during today's visit and you haven't received any updates within two weeks, please contact the referred provider directly to check on the status.  Recommended Screenings:  Health Maintenance  Topic Date Due   COVID-19 Vaccine (7 - 2025-26 season) 11/15/2023   Osteoporosis screening with Bone Density Scan  11/25/2023   Breast Cancer Screening  11/30/2023   Colon Cancer Screening  05/16/2024   Medicare Annual Wellness Visit  04/05/2025   DTaP/Tdap/Td vaccine (3 - Tdap) 12/11/2025   Pneumococcal Vaccine for age over 68  Completed   Flu Shot  Completed   Hepatitis C Screening  Completed   Zoster (Shingles) Vaccine  Completed   Meningitis B Vaccine  Aged Out       04/05/2024    9:30 AM  Advanced Directives  Does Patient Have a Medical Advance Directive? Yes  Type of Advance Directive Healthcare Power of Attorney  Copy of Healthcare Power of Attorney in Chart? No - copy requested    Vision: Annual vision screenings are recommended for early detection of glaucoma, cataracts, and diabetic retinopathy. These exams can also reveal signs of chronic conditions such as diabetes and high blood pressure.  Dental: Annual dental screenings help detect early signs of oral cancer, gum disease, and other conditions linked to overall health, including heart disease and  diabetes.  Please see the attached documents for additional preventive care recommendations.    Sheila Gray , Thank you for taking time to come for your Medicare Wellness Visit. I appreciate your ongoing commitment to your health goals. Please review the following plan we discussed and let me know if I can assist you in the future.   Screening recommendations/referrals: Colonoscopy:  Mammogram:  Bone Density:  Recommended yearly ophthalmology/optometry visit for glaucoma screening and checkup Recommended yearly dental visit for hygiene and checkup  Vaccinations: Influenza vaccine:  Pneumococcal vaccine:  Tdap vaccine:  Shingles vaccine:       Preventive Care 65 Years and Older, Female Preventive care refers to lifestyle choices and visits with your health care provider that can promote health and wellness. What does preventive care include? A yearly physical exam. This is also called an annual well check. Dental exams once or twice a year. Routine eye exams. Ask your health care provider how often you should have your eyes checked. Personal lifestyle choices, including: Daily care of your teeth and gums. Regular physical activity. Eating a healthy diet. Avoiding tobacco and drug use. Limiting alcohol use. Practicing safe sex. Taking low-dose aspirin every day. Taking vitamin and mineral supplements as recommended by your health care provider. What happens during an annual well check? The services and screenings done by your health care provider during your annual well check will depend on your age, overall health, lifestyle risk factors, and family history of disease. Counseling  Your health care provider may ask you questions about your:  Alcohol use. Tobacco use. Drug use. Emotional well-being. Home and relationship well-being. Sexual activity. Eating habits. History of falls. Memory and ability to understand (cognition). Work and work astronomer. Reproductive  health. Screening  You may have the following tests or measurements: Height, weight, and BMI. Blood pressure. Lipid and cholesterol levels. These may be checked every 5 years, or more frequently if you are over 43 years old. Skin check. Lung cancer screening. You may have this screening every year starting at age 26 if you have a 30-pack-year history of smoking and currently smoke or have quit within the past 15 years. Fecal occult blood test (FOBT) of the stool. You may have this test every year starting at age 73. Flexible sigmoidoscopy or colonoscopy. You may have a sigmoidoscopy every 5 years or a colonoscopy every 10 years starting at age 37. Hepatitis C blood test. Hepatitis B blood test. Sexually transmitted disease (STD) testing. Diabetes screening. This is done by checking your blood sugar (glucose) after you have not eaten for a while (fasting). You may have this done every 1-3 years. Bone density scan. This is done to screen for osteoporosis. You may have this done starting at age 8. Mammogram. This may be done every 1-2 years. Talk to your health care provider about how often you should have regular mammograms. Talk with your health care provider about your test results, treatment options, and if necessary, the need for more tests. Vaccines  Your health care provider may recommend certain vaccines, such as: Influenza vaccine. This is recommended every year. Tetanus, diphtheria, and acellular pertussis (Tdap, Td) vaccine. You may need a Td booster every 10 years. Zoster vaccine. You may need this after age 11. Pneumococcal 13-valent conjugate (PCV13) vaccine. One dose is recommended after age 57. Pneumococcal polysaccharide (PPSV23) vaccine. One dose is recommended after age 54. Talk to your health care provider about which screenings and vaccines you need and how often you need them. This information is not intended to replace advice given to you by your health care provider.  Make sure you discuss any questions you have with your health care provider. Document Released: 03/29/2015 Document Revised: 11/20/2015 Document Reviewed: 01/01/2015 Elsevier Interactive Patient Education  2017 [employer].  Fall Prevention in the Home Falls can cause injuries. They can happen to people of all ages. There are many things you can do to make your home safe and to help prevent falls. What can I do on the outside of my home? Regularly fix the edges of walkways and driveways and fix any cracks. Remove anything that might make you trip as you walk through a door, such as a raised step or threshold. Trim any bushes or trees on the path to your home. Use bright outdoor lighting. Clear any walking paths of anything that might make someone trip, such as rocks or tools. Regularly check to see if handrails are loose or broken. Make sure that both sides of any steps have handrails. Any raised decks and porches should have guardrails on the edges. Have any leaves, snow, or ice cleared regularly. Use sand or salt on walking paths during winter. Clean up any spills in your garage right away. This includes oil or grease spills. What can I do in the bathroom? Use night lights. Install grab bars by the toilet and in the tub and shower. Do not use towel bars as grab bars. Use non-skid mats or decals in the tub or shower. If you need to sit down in the  shower, use a plastic, non-slip stool. Keep the floor dry. Clean up any water that spills on the floor as soon as it happens. Remove soap buildup in the tub or shower regularly. Attach bath mats securely with double-sided non-slip rug tape. Do not have throw rugs and other things on the floor that can make you trip. What can I do in the bedroom? Use night lights. Make sure that you have a light by your bed that is easy to reach. Do not use any sheets or blankets that are too big for your bed. They should not hang down onto the floor. Have a  firm chair that has side arms. You can use this for support while you get dressed. Do not have throw rugs and other things on the floor that can make you trip. What can I do in the kitchen? Clean up any spills right away. Avoid walking on wet floors. Keep items that you use a lot in easy-to-reach places. If you need to reach something above you, use a strong step stool that has a grab bar. Keep electrical cords out of the way. Do not use floor polish or wax that makes floors slippery. If you must use wax, use non-skid floor wax. Do not have throw rugs and other things on the floor that can make you trip. What can I do with my stairs? Do not leave any items on the stairs. Make sure that there are handrails on both sides of the stairs and use them. Fix handrails that are broken or loose. Make sure that handrails are as long as the stairways. Check any carpeting to make sure that it is firmly attached to the stairs. Fix any carpet that is loose or worn. Avoid having throw rugs at the top or bottom of the stairs. If you do have throw rugs, attach them to the floor with carpet tape. Make sure that you have a light switch at the top of the stairs and the bottom of the stairs. If you do not have them, ask someone to add them for you. What else can I do to help prevent falls? Wear shoes that: Do not have high heels. Have rubber bottoms. Are comfortable and fit you well. Are closed at the toe. Do not wear sandals. If you use a stepladder: Make sure that it is fully opened. Do not climb a closed stepladder. Make sure that both sides of the stepladder are locked into place. Ask someone to hold it for you, if possible. Clearly mark and make sure that you can see: Any grab bars or handrails. First and last steps. Where the edge of each step is. Use tools that help you move around (mobility aids) if they are needed. These include: Canes. Walkers. Scooters. Crutches. Turn on the lights when you  go into a dark area. Replace any light bulbs as soon as they burn out. Set up your furniture so you have a clear path. Avoid moving your furniture around. If any of your floors are uneven, fix them. If there are any pets around you, be aware of where they are. Review your medicines with your doctor. Some medicines can make you feel dizzy. This can increase your chance of falling. Ask your doctor what other things that you can do to help prevent falls. This information is not intended to replace advice given to you by your health care provider. Make sure you discuss any questions you have with your health care provider. Document Released: 12/27/2008  Document Revised: 08/08/2015 Document Reviewed: 04/06/2014 Elsevier Interactive Patient Education  2017 [employer].

## 2024-04-05 NOTE — Progress Notes (Signed)
 "  Chief Complaint  Patient presents with   Medicare Wellness     Subjective:   Sheila Gray is a 74 y.o. female who presents for a Medicare Annual Wellness Visit.  No voiced or noted concerns at this time Patient advised to keep follow-up appointment with PCP (07-25-2024)   Visit info / Clinical Intake: Medicare Wellness Visit Type:: Subsequent Annual Wellness Visit Persons participating in visit and providing information:: patient Medicare Wellness Visit Mode:: Telephone If telephone:: video declined Since this visit was completed virtually, some vitals may be partially provided or unavailable. Missing vitals are due to the limitations of the virtual format.: Unable to obtain vitals - no equipment If Telephone or Video please confirm:: I connected with patient using audio/video enable telemedicine. I verified patient identity with two identifiers, discussed telehealth limitations, and patient agreed to proceed. Patient Location:: Home Provider Location:: office Interpreter Needed?: No Pre-visit prep was completed: no AWV questionnaire completed by patient prior to visit?: no Living arrangements:: lives with spouse/significant other Patient's Overall Health Status Rating: good Typical amount of pain: none Does pain affect daily life?: no Are you currently prescribed opioids?: no  Dietary Habits and Nutritional Risks How many meals a day?: 3 Eats fruit and vegetables daily?: yes Most meals are obtained by: preparing own meals; eating out In the last 2 weeks, have you had any of the following?: none Diabetic:: no  Functional Status Activities of Daily Living (to include ambulation/medication): Independent Ambulation: Independent Medication Administration: Independent Home Management (perform basic housework or laundry): Independent Manage your own finances?: yes Primary transportation is: driving Concerns about vision?: no *vision screening is required for WTM* Concerns  about hearing?: no  Fall Screening Falls in the past year?: 0 Number of falls in past year: 0 Was there an injury with Fall?: 0 Fall Risk Category Calculator: 0 Patient Fall Risk Level: Low Fall Risk  Fall Risk Patient at Risk for Falls Due to: No Fall Risks Fall risk Follow up: Falls evaluation completed; Education provided; Falls prevention discussed  Home and Transportation Safety: All rugs have non-skid backing?: (!) no All stairs or steps have railings?: yes Grab bars in the bathtub or shower?: yes Have non-skid surface in bathtub or shower?: yes Good home lighting?: yes Regular seat belt use?: yes Hospital stays in the last year:: no  Cognitive Assessment Difficulty concentrating, remembering, or making decisions? : no Will 6CIT or Mini Cog be Completed: yes What year is it?: 0 points What month is it?: 0 points Give patient an address phrase to remember (5 components): Its very sunny outside today in January About what time is it?: 0 points Count backwards from 20 to 1: 0 points Say the months of the year in reverse: 0 points Repeat the address phrase from earlier: 0 points 6 CIT Score: 0 points  Advance Directives (For Healthcare) Does Patient Have a Medical Advance Directive?: Yes Type of Advance Directive: Healthcare Power of Attorney Copy of Healthcare Power of Attorney in Chart?: No - copy requested  Reviewed/Updated  Reviewed/Updated: Reviewed All (Medical, Surgical, Family, Medications, Allergies, Care Teams, Patient Goals); Surgical History; Family History; Medications; Allergies; Care Teams; Patient Goals; Medical History    Allergies (verified) Amoxicillin and Levofloxacin   Current Medications (verified) Outpatient Encounter Medications as of 04/05/2024  Medication Sig   Calcium Citrate-Vitamin D  (CALCIUM CITRATE + D3 PO) Take 4 tablets daily by mouth.    Cholecalciferol (VITAMIN D  PO) Take 5,000 Units by mouth daily.   ezetimibe  (ZETIA )  10 MG  tablet Take  1 tablet  Daily for Cholesterol                               /                                                                   TAKE                                         BY                                                 MOUTH   fexofenadine (ALLEGRA) 180 MG tablet Take 180 mg by mouth daily.   fluticasone  (FLONASE ) 50 MCG/ACT nasal spray Place 2 sprays into both nostrils daily.   metroNIDAZOLE  (METROCREAM ) 0.75 % cream APPLY TO FACE AT BEDTIME   Famotidine (PEPCID PO) Take 20 mg by mouth daily.   No facility-administered encounter medications on file as of 04/05/2024.    History: Past Medical History:  Diagnosis Date   Allergy    GERD (gastroesophageal reflux disease)    Hyperlipidemia    Osteoporosis    Vitamin D  deficiency    History reviewed. No pertinent surgical history. Family History  Problem Relation Age of Onset   Hypertension Mother    Hearing loss Mother    Kidney disease Father    Heart disease Father    Cancer Sister    Cancer Maternal Aunt    Colon cancer Neg Hx    Esophageal cancer Neg Hx    Rectal cancer Neg Hx    Stomach cancer Neg Hx    Social History   Occupational History   Not on file  Tobacco Use   Smoking status: Never   Smokeless tobacco: Never  Substance and Sexual Activity   Alcohol use: Yes    Alcohol/week: 3.0 standard drinks of alcohol    Types: 1 Glasses of wine, 2 Standard drinks or equivalent per week    Comment: cocktail one a week   Drug use: No   Sexual activity: Yes   Tobacco Counseling Counseling given: Not Answered  SDOH Screenings   Food Insecurity: No Food Insecurity (04/05/2024)  Housing: Low Risk (04/05/2024)  Transportation Needs: No Transportation Needs (04/05/2024)  Utilities: Not At Risk (04/05/2024)  Alcohol Screen: Low Risk (01/25/2024)  Depression (PHQ2-9): Low Risk (04/05/2024)  Financial Resource Strain: Low Risk (01/25/2024)  Physical Activity: Sufficiently Active (04/05/2024)  Social  Connections: Socially Integrated (04/05/2024)  Stress: No Stress Concern Present (04/05/2024)  Tobacco Use: Low Risk (04/05/2024)  Health Literacy: Adequate Health Literacy (04/05/2024)   See flowsheets for full screening details  Depression Screen PHQ 2 & 9 Depression Scale- Over the past 2 weeks, how often have you been bothered by any of the following problems? Little interest or pleasure in doing things: 0 Feeling down, depressed, or hopeless (PHQ Adolescent also includes...irritable): 0 PHQ-2 Total Score: 0 Trouble falling  or staying asleep, or sleeping too much: 0 Feeling tired or having little energy: 0 Poor appetite or overeating (PHQ Adolescent also includes...weight loss): 0 Feeling bad about yourself - or that you are a failure or have let yourself or your family down: 0 Trouble concentrating on things, such as reading the newspaper or watching television (PHQ Adolescent also includes...like school work): 0 Moving or speaking so slowly that other people could have noticed. Or the opposite - being so fidgety or restless that you have been moving around a lot more than usual: 0 Thoughts that you would be better off dead, or of hurting yourself in some way: 0 PHQ-9 Total Score: 0 If you checked off any problems, how difficult have these problems made it for you to do your work, take care of things at home, or get along with other people?: Not difficult at all     Goals Addressed             This Visit's Progress    Weight (lb) < 200 lb (90.7 kg)   125 lb (56.7 kg)            Objective:    Today's Vitals   04/05/24 0928  Weight: 125 lb (56.7 kg)  Height: 5' 1 (1.549 m)   Body mass index is 23.62 kg/m.  Hearing/Vision screen Hearing Screening - Comments:: No trouble hearing Vision Screening - Comments:: [place] Up to date Immunizations and Health Maintenance Health Maintenance  Topic Date Due   COVID-19 Vaccine (7 - 2025-26 season) 11/15/2023   Bone Density  Scan  11/25/2023   Mammogram  11/30/2023   Colonoscopy  05/16/2024   Medicare Annual Wellness (AWV)  04/05/2025   DTaP/Tdap/Td (3 - Tdap) 12/11/2025   Pneumococcal Vaccine: 50+ Years  Completed   Influenza Vaccine  Completed   Hepatitis C Screening  Completed   Zoster Vaccines- Shingrix  Completed   Meningococcal B Vaccine  Aged Out        Assessment/Plan:  This is a routine wellness examination for Sheila Gray.  Patient Care Team: Jerrell Cleatus Ned, MD as PCP - General (Internal Medicine) Debrah Lamar BIRCH, MD (Inactive) as Consulting Physician (Gastroenterology) Love, Lynwood HERO, MD as Consulting Physician (Neurology) Levitin, Maude HERO, MD (Rheumatology) Livingston Rigg, MD as Consulting Physician (Dermatology) Anderson Maude ORN, MD (Inactive) as Consulting Physician (Orthopedic Surgery) Okey Arch, MD (Inactive) as Consulting Physician (Obstetrics and Gynecology)  I have personally reviewed and noted the following in the patients chart:   Medical and social history Use of alcohol, tobacco or illicit drugs  Current medications and supplements including opioid prescriptions. Functional ability and status Nutritional status Physical activity Advanced directives List of other physicians Hospitalizations, surgeries, and ER visits in previous 12 months Vitals Screenings to include cognitive, depression, and falls Referrals and appointments  No orders of the defined types were placed in this encounter.  In addition, I have reviewed and discussed with patient certain preventive protocols, quality metrics, and best practice recommendations. A written personalized care plan for preventive services as well as general preventive health recommendations were provided to patient.   Mliss Graff, LPN   [id number]/78/7973   Return in 1 year (on 04/05/2025).  After Visit Summary: (MyChart) Due to this being a telephonic visit, the after visit summary with patients personalized plan was offered to  patient via MyChart   Nurse Notes:  "

## 2024-04-17 ENCOUNTER — Emergency Department (HOSPITAL_COMMUNITY)
Admission: EM | Admit: 2024-04-17 | Discharge: 2024-04-18 | Disposition: A | Attending: Emergency Medicine | Admitting: Emergency Medicine

## 2024-04-17 ENCOUNTER — Emergency Department (HOSPITAL_COMMUNITY)

## 2024-04-17 ENCOUNTER — Ambulatory Visit: Payer: Self-pay

## 2024-04-17 ENCOUNTER — Encounter (HOSPITAL_COMMUNITY): Payer: Self-pay

## 2024-04-17 ENCOUNTER — Other Ambulatory Visit: Payer: Self-pay

## 2024-04-17 DIAGNOSIS — R42 Dizziness and giddiness: Secondary | ICD-10-CM | POA: Insufficient documentation

## 2024-04-17 DIAGNOSIS — I10 Essential (primary) hypertension: Secondary | ICD-10-CM | POA: Insufficient documentation

## 2024-04-17 LAB — CBC
HCT: 48.5 % — ABNORMAL HIGH (ref 36.0–46.0)
Hemoglobin: 15.9 g/dL — ABNORMAL HIGH (ref 12.0–15.0)
MCH: 31.4 pg (ref 26.0–34.0)
MCHC: 32.8 g/dL (ref 30.0–36.0)
MCV: 95.7 fL (ref 80.0–100.0)
Platelets: 251 10*3/uL (ref 150–400)
RBC: 5.07 MIL/uL (ref 3.87–5.11)
RDW: 12.5 % (ref 11.5–15.5)
WBC: 9.8 10*3/uL (ref 4.0–10.5)
nRBC: 0 % (ref 0.0–0.2)

## 2024-04-17 LAB — URINALYSIS, ROUTINE W REFLEX MICROSCOPIC
Bacteria, UA: NONE SEEN
Bilirubin Urine: NEGATIVE
Glucose, UA: NEGATIVE mg/dL
Hgb urine dipstick: NEGATIVE
Ketones, ur: 20 mg/dL — AB
Nitrite: NEGATIVE
Protein, ur: NEGATIVE mg/dL
Specific Gravity, Urine: 1.025 (ref 1.005–1.030)
pH: 6 (ref 5.0–8.0)

## 2024-04-17 LAB — SEDIMENTATION RATE: Sed Rate: 5 mm/h (ref 0–22)

## 2024-04-17 LAB — COMPREHENSIVE METABOLIC PANEL WITH GFR
ALT: 13 U/L (ref 0–44)
AST: 19 U/L (ref 15–41)
Albumin: 4.7 g/dL (ref 3.5–5.0)
Alkaline Phosphatase: 61 U/L (ref 38–126)
Anion gap: 13 (ref 5–15)
BUN: 18 mg/dL (ref 8–23)
CO2: 27 mmol/L (ref 22–32)
Calcium: 10 mg/dL (ref 8.9–10.3)
Chloride: 100 mmol/L (ref 98–111)
Creatinine, Ser: 0.86 mg/dL (ref 0.44–1.00)
GFR, Estimated: >60 mL/min
Glucose, Bld: 98 mg/dL (ref 70–99)
Potassium: 4.6 mmol/L (ref 3.5–5.1)
Sodium: 139 mmol/L (ref 135–145)
Total Bilirubin: 0.6 mg/dL (ref 0.0–1.2)
Total Protein: 7.7 g/dL (ref 6.5–8.1)

## 2024-04-17 LAB — C-REACTIVE PROTEIN: CRP: 0.7 mg/dL

## 2024-04-17 LAB — CBG MONITORING, ED: Glucose-Capillary: 107 mg/dL — ABNORMAL HIGH (ref 70–99)

## 2024-04-17 MED ORDER — LACTATED RINGERS IV BOLUS
500.0000 mL | Freq: Once | INTRAVENOUS | Status: AC
  Administered 2024-04-18: 500 mL via INTRAVENOUS

## 2024-04-17 MED ORDER — ONDANSETRON 4 MG PO TBDP: 4.0000 mg | ORAL_TABLET | Freq: Three times a day (TID) | ORAL | Status: DC | PRN

## 2024-04-17 MED ORDER — DIAZEPAM 5 MG/ML IJ SOLN
5.0000 mg | Freq: Once | INTRAMUSCULAR | Status: AC
  Administered 2024-04-18: 5 mg via INTRAVENOUS
  Filled 2024-04-17: qty 2

## 2024-04-17 NOTE — ED Provider Triage Note (Addendum)
 Emergency Medicine Provider Triage Evaluation Note  Sheila Gray , a 74 y.o. female  was evaluated in triage.  Pt complains of dizziness beginning about 2 PM yesterday afternoon with accompanying diplopia, nausea, and vomiting.  Dizziness is worse with movement of her head, more so up and down than left and right.  She has also had nystagmus laterally that is worse in the right eye than the left.  Denies unilateral weakness, slurred speech, or facial asymmetry.  Her husband accompanying her today is an ophthalmologist.  No history of BPPV.  Patient has also been having foul-smelling urine without dysuria, hematuria, or abdominal pain.  Review of Systems  Positive: Dizziness, diplopia, nausea, vomiting, nystagmus, foul-smelling urine Negative: Unilateral weakness, slurred speech, falls or head injury  Physical Exam  BP (!) 147/67 (BP Location: Left Arm)   Pulse 69   Temp (!) 97.4 F (36.3 C)   Resp 17   SpO2 99%  Gen:   Awake, no distress   Resp:  Normal effort  MSK:   Moves extremities without difficulty  Other:  Nystagmus with extreme ends of cardinal visual testing. Bilateral correction with cover-uncover test, more prominent on the right.  Equal strength and sensory of upper and lower extremities.  Normal finger-to-nose testing.  Medical Decision Making  Medically screening exam initiated at 7:09 PM.  Appropriate orders placed.  NORENE OLIVERI was informed that the remainder of the evaluation will be completed by another provider, this initial triage assessment does not replace that evaluation, and the importance of remaining in the ED until their evaluation is complete.   Sheila Almarie LABOR, PA-C 04/17/24 1917    Sheila Almarie LABOR, PA-C 04/17/24 1918

## 2024-04-17 NOTE — Telephone Encounter (Signed)
 FYI Only or Action Required?: FYI only for provider: appointment scheduled on 2/3.  Patient was last seen in primary care on 01/25/2024 by Jerrell Cleatus Ned, MD.  Called Nurse Triage reporting Dizziness.  Symptoms began yesterday.  Interventions attempted: OTC medications: Tylenol  and Prescription medications: Zofran .  Symptoms are: gradually worsening.  Triage Disposition: See PCP When Office is Open (Within 3 Days)  Patient/caregiver understands and will follow disposition?: Yes    Spoke with pts husband Dr. Robinson. On DPR. Sudden onset constant dizziness yesterday. Threw up a few times,  clear emesis after trying to drink water . Went to UC today. Was thought to be something related to pts ears as she had a lot of ear wax. This was irrigated. Prescribed zofran  and meclizine. Zofran  hasn't helped much, has not tried meclizine yet.   Constant moderate dizziness persists, no vertigo. None at rest. Able to stand and walk but with some difficulty. Worse when moving too fast. Pt is AAO. No weakness, numbness or changes to speech or vision. Husband states he did neuro exam which was negative aside from a nystagmus for a minute. VSS, BG normal, EKG was negative. No CP or SOB or confusion. No s/sx dehydration. No fever.   Husband wanting to know if pt can get a CT scan. Scheduled OV with PCP tomorrow. Unsure if office will be open tomorrow yet. If so, pt can do in person appt. If not, pt already has appt scheduled with PCP on Wednesday to be seen and discuss ordering a CT scan. Advised UC or ED for worsening symptoms.       Message from [place] S sent at 04/17/2024  2:18 PM EST  Summary: Dizziness   Reason for Triage: dizziness, requesting a CAT SCAN         Reason for Disposition  [1] MODERATE dizziness (e.g., interferes with normal activities) AND [2] has been evaluated by doctor (or NP/PA) for this  Answer Assessment - Initial Assessment Questions 1. DESCRIPTION: Describe your  dizziness.     Dizzy lightheaded feeling  2. LIGHTHEADED: Do you feel lightheaded? (e.g., somewhat faint, woozy, weak upon standing)     Yes  3. VERTIGO: Do you feel like either you or the room is spinning or tilting? (i.e., vertigo)     No  4. SEVERITY: How bad is it?  Do you feel like you are going to faint? Can you stand and walk?     Able to stand and walk but with some difficulty.  5. ONSET:  When did the dizziness begin?     Yesterday  6. AGGRAVATING FACTORS: Does anything make it worse? (e.g., standing, change in head position)     Moving head  7. HEART RATE: Can you tell me your heart rate? How many beats in 15 seconds?  (Note: Not all patients can do this.)       63 currently  8. CAUSE: What do you think is causing the dizziness? (e.g., decreased fluids or food, diarrhea, emotional distress, heat exposure, new medicine, sudden standing, vomiting; unknown)     Possibly ear related  9. RECURRENT SYMPTOM: Have you had dizziness before? If Yes, ask: When was the last time? What happened that time?     Never  10. OTHER SYMPTOMS: Do you have any other symptoms? (e.g., fever, chest pain, vomiting, diarrhea, bleeding)       Emesis when trying to drink  Protocols used: Dizziness - Lightheadedness-A-AH

## 2024-04-18 ENCOUNTER — Encounter: Payer: Self-pay | Admitting: Student in an Organized Health Care Education/Training Program

## 2024-04-18 ENCOUNTER — Ambulatory Visit: Admitting: Student in an Organized Health Care Education/Training Program

## 2024-04-18 VITALS — BP 124/58 | HR 62 | Temp 97.6°F | Ht 61.0 in | Wt 125.0 lb

## 2024-04-18 DIAGNOSIS — H8120 Vestibular neuronitis, unspecified ear: Secondary | ICD-10-CM | POA: Insufficient documentation

## 2024-04-18 MED ORDER — STERILE WATER FOR INJECTION IJ SOLN
INTRAMUSCULAR | Status: AC
  Administered 2024-04-18: 10 mL
  Filled 2024-04-18: qty 10

## 2024-04-18 NOTE — Patient Instructions (Signed)
" °  VISIT SUMMARY: Sheila Gray, a 74 year old female, visited due to dizziness and nausea that began after waking from a nap. She experienced vomiting and required assistance with walking due to dizziness. She was evaluated at [facility], where an MRI ruled out serious conditions, and she received hydration and medication. She reports feeling slightly better but still experiences dizziness and nausea.  YOUR PLAN: -VESTIBULAR NEURITIS: Vestibular neuritis is an inner ear condition causing dizziness, nausea, and vomiting. Your MRI ruled out serious conditions like stroke or tumor. Continue taking meclizine as needed for dizziness. If nausea worsens, consider taking Zofran . Avoid Valium  unless symptoms become severe. Rest and gradually increase your activity as you feel better. Monitor your symptoms and inform us  if they worsen.  -HYPERTENSION: Hypertension is high blood pressure. Your blood pressure is well-controlled with your current medication. Continue your current antihypertensive regimen.  INSTRUCTIONS: Continue taking meclizine as needed for dizziness. If nausea worsens, consider taking Zofran . Avoid Valium  unless symptoms become severe. Rest and gradually increase your activity as you feel better. Monitor your symptoms and inform us  if they worsen. Continue your current antihypertensive regimen.    Contains text generated by [employer].   "

## 2024-04-18 NOTE — Telephone Encounter (Signed)
 Noted.

## 2024-04-18 NOTE — Assessment & Plan Note (Signed)
 Acute vestibular syndrome presents with dizziness, nausea, and vomiting. MRI ruled out stroke, tumor, and MS. HINTS exam is consistent with a peripheral cause.  The condition is likely self-limited and possibly viral.  No symptoms to suggest Mnire's or vestibular migraine.  Meclizine was prescribed, though its effectiveness is uncertain. Steroids are not recommended. Continue meclizine as needed for dizziness. Consider Zofran  or Phenergan for nausea if symptoms worsen. Avoid Valium  unless symptoms become severe. Rest is encouraged with a gradual increase in activity as tolerated. Monitor symptoms and update if the condition worsens.  Would not start vestibular physical therapy until the nausea is improved.

## 2024-04-18 NOTE — ED Notes (Signed)
 Stood patient and ambulated at the bedside. Patient is unsteady with mild dizziness. Patient reports that this is better thank earlier but still not her baseline.

## 2024-04-19 ENCOUNTER — Ambulatory Visit: Admitting: Student in an Organized Health Care Education/Training Program

## 2024-07-19 ENCOUNTER — Ambulatory Visit: Admitting: Student in an Organized Health Care Education/Training Program

## 2024-07-25 ENCOUNTER — Ambulatory Visit: Admitting: Student in an Organized Health Care Education/Training Program
